# Patient Record
Sex: Female | Born: 1935
Health system: Southern US, Community
[De-identification: ages and names within clinical notes are randomized; demographics above are authoritative.]

## PROBLEM LIST (undated history)

## (undated) DIAGNOSIS — I2510 Atherosclerotic heart disease of native coronary artery without angina pectoris: Secondary | ICD-10-CM

## (undated) DIAGNOSIS — I219 Acute myocardial infarction, unspecified: Secondary | ICD-10-CM

## (undated) DIAGNOSIS — I10 Essential (primary) hypertension: Secondary | ICD-10-CM

## (undated) DIAGNOSIS — R112 Nausea with vomiting, unspecified: Secondary | ICD-10-CM

## (undated) DIAGNOSIS — E119 Type 2 diabetes mellitus without complications: Secondary | ICD-10-CM

## (undated) DIAGNOSIS — Z9889 Other specified postprocedural states: Secondary | ICD-10-CM

## (undated) HISTORY — DX: Nausea with vomiting, unspecified: R11.2

## (undated) HISTORY — DX: Type 2 diabetes mellitus without complications: E11.9

## (undated) HISTORY — PX: CARDIAC SURGERY: SHX584

## (undated) HISTORY — DX: Other specified postprocedural states: Z98.890

---

## 1997-09-10 DIAGNOSIS — I219 Acute myocardial infarction, unspecified: Secondary | ICD-10-CM

## 1997-09-10 HISTORY — DX: Acute myocardial infarction, unspecified: I21.9

## 2012-06-08 DIAGNOSIS — R079 Chest pain, unspecified: Secondary | ICD-10-CM

## 2013-01-19 ENCOUNTER — Encounter (INDEPENDENT_AMBULATORY_CARE_PROVIDER_SITE_OTHER): Payer: Self-pay

## 2013-07-07 ENCOUNTER — Ambulatory Visit (INDEPENDENT_AMBULATORY_CARE_PROVIDER_SITE_OTHER): Payer: Medicare Other

## 2013-07-07 ENCOUNTER — Encounter (INDEPENDENT_AMBULATORY_CARE_PROVIDER_SITE_OTHER): Payer: Self-pay

## 2013-07-07 DIAGNOSIS — Z23 Encounter for immunization: Secondary | ICD-10-CM

## 2013-09-08 ENCOUNTER — Telehealth: Payer: Self-pay | Admitting: *Deleted

## 2013-09-08 MED ORDER — AZITHROMYCIN 250 MG PO TABS: ORAL_TABLET | ORAL | Status: DC

## 2013-09-08 NOTE — Telephone Encounter (Signed)
Per DWm for sinus issues

## 2013-09-26 ENCOUNTER — Ambulatory Visit (INDEPENDENT_AMBULATORY_CARE_PROVIDER_SITE_OTHER): Payer: Medicare Other | Admitting: General Practice

## 2013-09-26 ENCOUNTER — Encounter: Payer: Self-pay | Admitting: General Practice

## 2013-09-26 VITALS — BP 139/73 | HR 64 | Temp 97.3°F | Ht 60.0 in | Wt 131.0 lb

## 2013-09-26 DIAGNOSIS — R42 Dizziness and giddiness: Secondary | ICD-10-CM

## 2013-09-26 DIAGNOSIS — H6120 Impacted cerumen, unspecified ear: Secondary | ICD-10-CM

## 2013-09-26 MED ORDER — MECLIZINE HCL 25 MG PO TABS: 25.0000 mg | ORAL_TABLET | Freq: Three times a day (TID) | ORAL | Status: DC | PRN

## 2013-09-26 NOTE — Progress Notes (Signed)
   Subjective:    Patient ID: Ariel Rodriguez, female    DOB: 1931/04/28, 80 y.o.   MRN: [id number]  Dizziness This is a new problem. The current episode started yesterday. The problem has been unchanged. Associated symptoms include vertigo. Pertinent negatives include no chest pain, chills, congestion, coughing, fatigue, fever, nausea, neck pain, numbness, sore throat, visual change, vomiting or weakness. Exacerbated by: turning of head. She has tried lying down and rest for the symptoms.      Review of Systems  Constitutional: Negative for fever, chills and fatigue.  HENT: Negative for congestion and sore throat.   Respiratory: Negative for cough.   Cardiovascular: Negative for chest pain.  Gastrointestinal: Negative for nausea and vomiting.  Musculoskeletal: Negative for neck pain.  Neurological: Positive for dizziness and vertigo. Negative for weakness and numbness.       Objective:   Physical Exam  Constitutional: She is oriented to person, place, and time. She appears well-developed and well-nourished.  HENT:  Head: Normocephalic and atraumatic.  Right Ear: External ear normal.  Left Ear: External ear normal.  Nose: Nose normal.  Mouth/Throat: Oropharynx is clear and moist.  Cerumen impaction to bilateral ears, unable to visualize TM -after cerumen removal, bilateral TM pearly gray  Cardiovascular: Normal rate, regular rhythm and normal heart sounds.   Pulmonary/Chest: Effort normal and breath sounds normal. No respiratory distress. She exhibits no tenderness.  Neurological: She is alert and oriented to person, place, and time.  Skin: Skin is warm and dry.  Psychiatric: She has a normal mood and affect.          Assessment & Plan:  1. Vertigo  - meclizine (ANTIVERT) 25 MG tablet; Take 1 tablet (25 mg total) by mouth 3 (three) times daily as needed.  Dispense: 30 tablet; Refill: 0 -sedation precaution discussed -patient education provided and discussed  2.  Cerumen impaction -attempted cerumen removal manually (partially removed) -ears lavaged  -cerumen removal successful -RTO if symptoms worsen or unresolved Patient verbalized understanding Erby Pian, FNP-C

## 2013-09-26 NOTE — Patient Instructions (Signed)
Vertigo Vertigo means you feel like you or your surroundings are moving when they are not. Vertigo can be dangerous if it occurs when you are at work, driving, or performing difficult activities.  CAUSES  Vertigo occurs when there is a conflict of signals sent to your brain from the visual and sensory systems in your body. There are many different causes of vertigo, including:  Infections, especially in the inner ear.  A bad reaction to a drug or misuse of alcohol and medicines.  Withdrawal from drugs or alcohol.  Rapidly changing positions, such as lying down or rolling over in bed.  A migraine headache.  Decreased blood flow to the brain.  Increased pressure in the brain from a head injury, infection, tumor, or bleeding. SYMPTOMS  You may feel as though the world is spinning around or you are falling to the ground. Because your balance is upset, vertigo can cause nausea and vomiting. You may have involuntary eye movements (nystagmus). DIAGNOSIS  Vertigo is usually diagnosed by physical exam. If the cause of your vertigo is unknown, your caregiver may perform imaging tests, such as an MRI scan (magnetic resonance imaging). TREATMENT  Most cases of vertigo resolve on their own, without treatment. Depending on the cause, your caregiver may prescribe certain medicines. If your vertigo is related to body position issues, your caregiver may recommend movements or procedures to correct the problem. In rare cases, if your vertigo is caused by certain inner ear problems, you may need surgery. HOME CARE INSTRUCTIONS   Follow your caregiver's instructions.  Avoid driving.  Avoid operating heavy machinery.  Avoid performing any tasks that would be dangerous to you or others during a vertigo episode.  Tell your caregiver if you notice that certain medicines seem to be causing your vertigo. Some of the medicines used to treat vertigo episodes can actually make them worse in some people. SEEK  IMMEDIATE MEDICAL CARE IF:   Your medicines do not relieve your vertigo or are making it worse.  You develop problems with talking, walking, weakness, or using your arms, hands, or legs.  You develop severe headaches.  Your nausea or vomiting continues or gets worse.  You develop visual changes.  A family member notices behavioral changes.  Your condition gets worse. MAKE SURE YOU:  Understand these instructions.  Will watch your condition.  Will get help right away if you are not doing well or get worse. Document Released: 06/06/2005 Document Revised: 11/19/2011 Document Reviewed: 03/15/2011 Exit[employer] Patient Information 2014 [employer].  

## 2014-02-08 ENCOUNTER — Emergency Department (HOSPITAL_COMMUNITY): Payer: Medicare Other

## 2014-02-08 ENCOUNTER — Inpatient Hospital Stay (HOSPITAL_COMMUNITY)
Admission: EM | Admit: 2014-02-08 | Discharge: 2014-02-10 | DRG: 287 | Disposition: A | Discharge status: Home / Self Care | Payer: Medicare Other | Attending: Cardiology | Admitting: Cardiology

## 2014-02-08 ENCOUNTER — Encounter (HOSPITAL_COMMUNITY): Payer: Self-pay | Admitting: Emergency Medicine

## 2014-02-08 DIAGNOSIS — I252 Old myocardial infarction: Secondary | ICD-10-CM

## 2014-02-08 DIAGNOSIS — Z7982 Long term (current) use of aspirin: Secondary | ICD-10-CM

## 2014-02-08 DIAGNOSIS — E785 Hyperlipidemia, unspecified: Secondary | ICD-10-CM | POA: Diagnosis present

## 2014-02-08 DIAGNOSIS — E119 Type 2 diabetes mellitus without complications: Secondary | ICD-10-CM | POA: Diagnosis present

## 2014-02-08 DIAGNOSIS — Z951 Presence of aortocoronary bypass graft: Secondary | ICD-10-CM

## 2014-02-08 DIAGNOSIS — I10 Essential (primary) hypertension: Secondary | ICD-10-CM | POA: Diagnosis present

## 2014-02-08 DIAGNOSIS — Z79899 Other long term (current) drug therapy: Secondary | ICD-10-CM

## 2014-02-08 DIAGNOSIS — I2510 Atherosclerotic heart disease of native coronary artery without angina pectoris: Secondary | ICD-10-CM | POA: Diagnosis present

## 2014-02-08 DIAGNOSIS — E876 Hypokalemia: Secondary | ICD-10-CM | POA: Diagnosis present

## 2014-02-08 DIAGNOSIS — I200 Unstable angina: Principal | ICD-10-CM | POA: Insufficient documentation

## 2014-02-08 DIAGNOSIS — E7800 Pure hypercholesterolemia, unspecified: Secondary | ICD-10-CM

## 2014-02-08 HISTORY — DX: Acute myocardial infarction, unspecified: I21.9

## 2014-02-08 HISTORY — DX: Essential (primary) hypertension: I10

## 2014-02-08 HISTORY — DX: Atherosclerotic heart disease of native coronary artery without angina pectoris: I25.10

## 2014-02-08 LAB — CBC
HEMATOCRIT: 36.5 % (ref 36.0–46.0)
HEMOGLOBIN: 12.8 g/dL (ref 12.0–15.0)
MCH: 31.8 pg (ref 26.0–34.0)
MCHC: 35.1 g/dL (ref 30.0–36.0)
MCV: 90.6 fL (ref 78.0–100.0)
Platelets: 209 10*3/uL (ref 150–400)
RBC: 4.03 MIL/uL (ref 3.87–5.11)
RDW: 12.9 % (ref 11.5–15.5)
WBC: 8.6 10*3/uL (ref 4.0–10.5)

## 2014-02-08 LAB — BASIC METABOLIC PANEL
BUN: 12 mg/dL (ref 6–23)
CHLORIDE: 94 meq/L — AB (ref 96–112)
CO2: 24 meq/L (ref 19–32)
CREATININE: 0.85 mg/dL (ref 0.50–1.10)
Calcium: 10 mg/dL (ref 8.4–10.5)
GFR calc non Af Amer: 63 mL/min — ABNORMAL LOW (ref >90)
GFR, EST AFRICAN AMERICAN: 72 mL/min — AB (ref >90)
GLUCOSE: 109 mg/dL — AB (ref 70–99)
POTASSIUM: 3.7 meq/L (ref 3.7–5.3)
Sodium: 137 mEq/L (ref 137–147)

## 2014-02-08 LAB — I-STAT TROPONIN, ED: Troponin i, poc: 0 ng/mL (ref 0.00–0.08)

## 2014-02-08 LAB — TSH: TSH: 3.86 u[IU]/mL (ref 0.350–4.500)

## 2014-02-08 LAB — GLUCOSE, CAPILLARY: Glucose-Capillary: 97 mg/dL (ref 70–99)

## 2014-02-08 LAB — PRO B NATRIURETIC PEPTIDE
PRO B NATRI PEPTIDE: 146.6 pg/mL (ref 0–450)
Pro B Natriuretic peptide (BNP): 110.5 pg/mL (ref 0–450)

## 2014-02-08 LAB — TROPONIN I: Troponin I: <0.3 ng/mL (ref <0.30)

## 2014-02-08 MED ORDER — NITROGLYCERIN 0.4 MG SL SUBL: 0.4000 mg | SUBLINGUAL_TABLET | SUBLINGUAL | Status: DC | PRN

## 2014-02-08 MED ORDER — HEPARIN BOLUS VIA INFUSION
3500.0000 [IU] | Freq: Once | INTRAVENOUS | Status: AC
  Administered 2014-02-08: 3500 [IU] via INTRAVENOUS
  Filled 2014-02-08: qty 3500

## 2014-02-08 MED ORDER — TICAGRELOR 90 MG PO TABS
180.0000 mg | ORAL_TABLET | Freq: Once | ORAL | Status: AC
  Administered 2014-02-09: 180 mg via ORAL
  Filled 2014-02-08: qty 2

## 2014-02-08 MED ORDER — LEVOTHYROXINE SODIUM 75 MCG PO TABS
75.0000 ug | ORAL_TABLET | Freq: Every day | ORAL | Status: DC
  Administered 2014-02-09 – 2014-02-10 (×2): 75 ug via ORAL
  Filled 2014-02-08 (×4): qty 1

## 2014-02-08 MED ORDER — HYDROCHLOROTHIAZIDE 12.5 MG PO CAPS
12.5000 mg | ORAL_CAPSULE | Freq: Every day | ORAL | Status: DC
  Filled 2014-02-08: qty 1

## 2014-02-08 MED ORDER — ALPRAZOLAM 0.25 MG PO TABS
0.2500 mg | ORAL_TABLET | Freq: Every evening | ORAL | Status: DC | PRN
Start: 2014-02-08 — End: 2014-02-10
  Administered 2014-02-08 – 2014-02-09 (×2): 0.25 mg via ORAL
  Filled 2014-02-08 (×2): qty 1

## 2014-02-08 MED ORDER — PANTOPRAZOLE SODIUM 40 MG PO TBEC
40.0000 mg | DELAYED_RELEASE_TABLET | Freq: Every day | ORAL | Status: DC
  Administered 2014-02-09 – 2014-02-10 (×2): 40 mg via ORAL
  Filled 2014-02-08 (×2): qty 1

## 2014-02-08 MED ORDER — ATENOLOL 25 MG PO TABS
25.0000 mg | ORAL_TABLET | Freq: Every day | ORAL | Status: DC
  Administered 2014-02-09 – 2014-02-10 (×2): 25 mg via ORAL
  Filled 2014-02-08 (×2): qty 1

## 2014-02-08 MED ORDER — LISINOPRIL 20 MG PO TABS
20.0000 mg | ORAL_TABLET | Freq: Every day | ORAL | Status: DC
  Administered 2014-02-09 – 2014-02-10 (×2): 20 mg via ORAL
  Filled 2014-02-08 (×2): qty 1

## 2014-02-08 MED ORDER — ASPIRIN 81 MG PO CHEW
243.0000 mg | CHEWABLE_TABLET | Freq: Once | ORAL | Status: AC
  Administered 2014-02-08: 243 mg via ORAL
  Filled 2014-02-08: qty 3

## 2014-02-08 MED ORDER — TICAGRELOR 90 MG PO TABS
90.0000 mg | ORAL_TABLET | Freq: Two times a day (BID) | ORAL | Status: DC
  Filled 2014-02-08: qty 1

## 2014-02-08 MED ORDER — SODIUM CHLORIDE 0.9 % IV SOLN
INTRAVENOUS | Status: DC
  Administered 2014-02-09: 07:00:00 via INTRAVENOUS

## 2014-02-08 MED ORDER — SODIUM CHLORIDE 0.9 % IV SOLN: 250.0000 mL | INTRAVENOUS | Status: DC | PRN

## 2014-02-08 MED ORDER — INSULIN ASPART 100 UNIT/ML ~~LOC~~ SOLN: 0.0000 [IU] | Freq: Three times a day (TID) | SUBCUTANEOUS | Status: DC

## 2014-02-08 MED ORDER — ATORVASTATIN CALCIUM 10 MG PO TABS
10.0000 mg | ORAL_TABLET | Freq: Every day | ORAL | Status: DC
  Administered 2014-02-09 – 2014-02-10 (×2): 10 mg via ORAL
  Filled 2014-02-08 (×2): qty 1

## 2014-02-08 MED ORDER — SODIUM CHLORIDE 0.9 % IJ SOLN
3.0000 mL | Freq: Two times a day (BID) | INTRAMUSCULAR | Status: DC
  Administered 2014-02-09: 3 mL via INTRAVENOUS

## 2014-02-08 MED ORDER — VITAMIN D3 25 MCG (1000 UNIT) PO TABS
1000.0000 [IU] | ORAL_TABLET | Freq: Every day | ORAL | Status: DC
  Administered 2014-02-09 – 2014-02-10 (×2): 1000 [IU] via ORAL
  Filled 2014-02-08 (×2): qty 1

## 2014-02-08 MED ORDER — ATENOLOL 50 MG PO TABS: 50.0000 mg | ORAL_TABLET | Freq: Every day | ORAL | Status: DC

## 2014-02-08 MED ORDER — SODIUM CHLORIDE 0.9 % IJ SOLN: 3.0000 mL | INTRAMUSCULAR | Status: DC | PRN

## 2014-02-08 MED ORDER — ASPIRIN EC 81 MG PO TBEC
81.0000 mg | DELAYED_RELEASE_TABLET | Freq: Every day | ORAL | Status: DC
  Administered 2014-02-09 – 2014-02-10 (×2): 81 mg via ORAL
  Filled 2014-02-08 (×2): qty 1

## 2014-02-08 MED ORDER — LISINOPRIL-HYDROCHLOROTHIAZIDE 20-12.5 MG PO TABS: 1.0000 | ORAL_TABLET | Freq: Every day | ORAL | Status: DC

## 2014-02-08 MED ORDER — HEPARIN (PORCINE) IN NACL 100-0.45 UNIT/ML-% IJ SOLN
900.0000 [IU]/h | INTRAMUSCULAR | Status: DC
  Administered 2014-02-08: 700 [IU]/h via INTRAVENOUS
  Filled 2014-02-08 (×2): qty 250

## 2014-02-08 NOTE — ED Notes (Signed)
Pt reports MI in 1999 that had chest pain, neck pain and headache similar to today but worse and wanted to be checked for same, MI and heart surgery then performed at [facility].

## 2014-02-08 NOTE — ED Provider Notes (Signed)
CSN: [id number]     Arrival date & time 02/08/14  1435 History   First MD Initiated Contact with Patient 02/08/14 1525     Chief Complaint  Patient presents with  . Chest Pain     (Consider location/radiation/quality/duration/timing/severity/associated sxs/prior Treatment) HPI Comments: Patient is an 80 year old female with past medical history of coronary artery disease, status post CABG in 1999. She presents today with complaints of tightness in her chest, right shoulder, and back that has been occurring intermittently for the past 2 weeks. This is related with exertion and is relieved with rest. She states she can not make up the steps without having to rest. She denies any fevers or chills. She does feel short of breath with these symptoms. These symptoms are not unlike what she experienced with her prior cardiac event in 1999.  Her cardiologist and heart surgeon are both at [facility].  Patient is a 80 y.o. female presenting with chest pain. The history is provided by the patient.  Chest Pain Pain location:  Substernal area and R chest Pain quality: tightness   Pain radiates to:  R shoulder Pain radiates to the back: yes   Pain severity:  Moderate Onset quality:  Gradual Duration:  2 weeks Timing:  Constant Progression:  Worsening Chronicity:  New Relieved by:  Rest Worsened by:  Exertion   Past Medical History  Diagnosis Date  . Diabetes mellitus without complication   . Hypertension   . Heart attack   . Coronary artery disease    Past Surgical History  Procedure Laterality Date  . Cardiac surgery     History reviewed. No pertinent family history. History  Substance Use Topics  . Smoking status: Never Smoker   . Smokeless tobacco: Not on file  . Alcohol Use: No   OB History   Grav Para Term Preterm Abortions TAB SAB Ect Mult Living                 Review of Systems  Cardiovascular: Positive for chest pain.  All other systems reviewed and are  negative.     Allergies  Ciprofloxacin and Erythromycin  Home Medications   Prior to Admission medications   Medication Sig Start Date End Date Taking? Authorizing Provider  ALPRAZolam Duanne Moron) 0.25 MG tablet Take 0.25 mg by mouth at bedtime as needed for anxiety.   Yes Historical Provider, MD  aspirin 325 MG tablet Take 325 mg by mouth daily.   Yes Historical Provider, MD  atenolol (TENORMIN) 50 MG tablet Take 50 mg by mouth daily. Takes one half daily   Yes Historical Provider, MD  atorvastatin (LIPITOR) 10 MG tablet Take 10 mg by mouth daily.   Yes Historical Provider, MD  Carboxymethylcellulose Sodium (REFRESH LIQUIGEL OP) Place 1 drop into both eyes at bedtime.   Yes Historical Provider, MD  cholecalciferol (VITAMIN D) 1000 UNITS tablet Take 1,000 Units by mouth daily.   Yes Historical Provider, MD  Cinnamon 500 MG capsule Take 500 mg by mouth every evening.   Yes Historical Provider, MD  DHA-EPA-Coenzyme Q10-Vitamin E 120-180-50-30 CAPS Take 1 capsule by mouth daily.   Yes Historical Provider, MD  levothyroxine (SYNTHROID, LEVOTHROID) 75 MCG tablet Take 75 mcg by mouth daily before breakfast.   Yes Historical Provider, MD  lisinopril-hydrochlorothiazide (PRINZIDE,ZESTORETIC) 20-12.5 MG per tablet Take 1 tablet by mouth daily.  01/27/14  Yes Historical Provider, MD  metFORMIN (GLUCOPHAGE) 500 MG tablet Take 500 mg by mouth 2 (two) times daily with a meal.  Yes Historical Provider, MD  Multiple Vitamin (MULTIVITAMIN) tablet Take 1 tablet by mouth every evening.    Yes Historical Provider, MD  NITROSTAT 0.4 MG SL tablet Place 0.4 mg under the tongue every 5 (five) minutes as needed.  01/06/14  Yes Historical Provider, MD  OVER THE COUNTER MEDICATION Apply 1 patch topically daily as needed (pain). Pain patch   Yes Historical Provider, MD  pantoprazole (PROTONIX) 40 MG tablet Take 40 mg by mouth daily.   Yes Historical Provider, MD  Polyvinyl Alcohol-Povidone (REFRESH OP) Place 1 drop into  both eyes 2 (two) times daily as needed (dry eyes).   Yes Historical Provider, MD   BP 165/72  Pulse 64  Temp(Src) 98 F (36.7 C) (Oral)  Resp 14  Ht 5' (1.524 m)  Wt 131 lb (59.421 kg)  BMI 25.58 kg/m2  SpO2 100% Physical Exam  Nursing note and vitals reviewed. Constitutional: She is oriented to person, place, and time. She appears well-developed and well-nourished. No distress.  HENT:  Head: Normocephalic and atraumatic.  Neck: Normal range of motion. Neck supple.  Cardiovascular: Normal rate and regular rhythm.  Exam reveals no gallop and no friction rub.   No murmur heard. Pulmonary/Chest: Effort normal and breath sounds normal. No respiratory distress. She has no wheezes.  Abdominal: Soft. Bowel sounds are normal. She exhibits no distension. There is no tenderness.  Musculoskeletal: Normal range of motion. She exhibits no edema.  Lymphadenopathy:    She has no cervical adenopathy.  Neurological: She is alert and oriented to person, place, and time.  Skin: Skin is warm and dry. She is not diaphoretic.    ED Course  Procedures (including critical care time) Labs Review Labs Reviewed  [facility], ED    Imaging Review No results found.   EKG Interpretation   Date/Time:  Monday February 08 2014 14:40:06 EDT Ventricular Rate:  71 PR Interval:  152 QRS Duration: 90 QT Interval:  378 QTC Calculation: 410 R Axis:   -45 Text Interpretation:  Normal sinus rhythm Left axis deviation Anteroseptal  infarct , age undetermined T wave abnormality, consider lateral ischemia  Abnormal ECG No priors for comparison Confirmed by DELOS  MD, Nathaneil Canary  [phone]) on 02/08/2014 3:35:09 PM      MDM   Final diagnoses:  None    Patient is an 80 year old female with history of coronary artery bypass graft 16 years ago. She presents today with complaints of discomfort in her chest and back that is not unlike the symptoms she  experienced with her prior MRI. Her cardiologist and heart surgeon are both at [facility], however she prefers to come here due to logistical reasons. Workup reveals no evidence for MI, however do to the nature of her symptoms and her extensive cardiac history, feel as though admission to the hospital is indicated. I've spoken with Dr. Burt Knack from cardiology who will evaluate patient.    Veryl Speak, MD 02/09/14 [phone]

## 2014-02-08 NOTE — Consult Note (Signed)
History and Physical  Patient ID: LARANDA BURKEMPER MRN: [id number], SOB: June 29, 1931 80 y.o. Date of Encounter: 02/08/2014, 6:01 PM  Primary Physician: Redge Gainer, MD Primary Cardiologist: Dr Duke Salvia [facility])  Chief Complaint: Chest pain  HPI: 80 y.o. female w/ PMHx significant for CAD s/p CABG who presented to [facility] on 02/08/2014 with complaints of chest and back pain.  The patient has a history of coronary artery disease. She underwent CABG in 1999 at [facility]. I believe she was grafted with the left radial and LIMA conduit. She has done well until the past 2 weeks when she has developed mid scapular upper back pain and chest pain. She describes this as a pressure and squeezing like sensation. It feels similar to the symptoms she had just before CABG, but less severe. She admits to worsening of her symptoms over the past few days with increased intensity and frequency of symptoms. Her chest discomfort radiates to the neck. There is associated shortness of breath. She denies diaphoresis, lightheadedness, nausea, vomiting, or syncope.  She otherwise has been in her usual state of health. She reports compliance with her medications. She denies any recent illness and specifically denies fevers, chills, respiratory symptoms, or GI symptoms. She is currently chest pain-free at rest.   Past Medical History  Diagnosis Date  . Diabetes mellitus without complication   . Hypertension   . Heart attack   . Coronary artery disease      Surgical History:  Past Surgical History  Procedure Laterality Date  . Cardiac surgery       Home Meds: Prior to Admission medications   Medication Sig Start Date End Date Taking? Authorizing Provider  ALPRAZolam Duanne Moron) 0.25 MG tablet Take 0.25 mg by mouth at bedtime as needed for anxiety.   Yes Historical Provider, MD  aspirin 325 MG tablet Take 325 mg by mouth daily.   Yes Historical Provider, MD    atenolol (TENORMIN) 50 MG tablet Take 50 mg by mouth daily. Takes one half daily   Yes Historical Provider, MD  atorvastatin (LIPITOR) 10 MG tablet Take 10 mg by mouth daily.   Yes Historical Provider, MD  Carboxymethylcellulose Sodium (REFRESH LIQUIGEL OP) Place 1 drop into both eyes at bedtime.   Yes Historical Provider, MD  cholecalciferol (VITAMIN D) 1000 UNITS tablet Take 1,000 Units by mouth daily.   Yes Historical Provider, MD  Cinnamon 500 MG capsule Take 500 mg by mouth every evening.   Yes Historical Provider, MD  DHA-EPA-Coenzyme Q10-Vitamin E 120-180-50-30 CAPS Take 1 capsule by mouth daily.   Yes Historical Provider, MD  levothyroxine (SYNTHROID, LEVOTHROID) 75 MCG tablet Take 75 mcg by mouth daily before breakfast.   Yes Historical Provider, MD  lisinopril-hydrochlorothiazide (PRINZIDE,ZESTORETIC) 20-12.5 MG per tablet Take 1 tablet by mouth daily.  01/27/14  Yes Historical Provider, MD  metFORMIN (GLUCOPHAGE) 500 MG tablet Take 500 mg by mouth 2 (two) times daily with a meal.    Yes Historical Provider, MD  Multiple Vitamin (MULTIVITAMIN) tablet Take 1 tablet by mouth every evening.    Yes Historical Provider, MD  NITROSTAT 0.4 MG SL tablet Place 0.4 mg under the tongue every 5 (five) minutes as needed for chest pain.  01/06/14  Yes Historical Provider, MD  OVER THE COUNTER MEDICATION Apply 1 patch topically daily as needed (pain). Pain patch   Yes Historical Provider, MD  pantoprazole (PROTONIX) 40 MG tablet Take 40 mg by mouth daily.  Yes Historical Provider, MD  Polyvinyl Alcohol-Povidone (REFRESH OP) Place 1 drop into both eyes 2 (two) times daily as needed (dry eyes).   Yes Historical Provider, MD    Allergies:  Allergies  Allergen Reactions  . Ciprofloxacin Diarrhea and Nausea And Vomiting  . Erythromycin Diarrhea and Nausea And Vomiting    History   Social History  . Marital Status: Married    Spouse Name: N/A    Number of Children: N/A  . Years of Education: N/A    Occupational History  . Not on file.   Social History Main Topics  . Smoking status: Never Smoker   . Smokeless tobacco: Not on file  . Alcohol Use: No  . Drug Use: No  . Sexual Activity: Not on file   Other Topics Concern  . Not on file   Social History Narrative  . No narrative on file     Family history: Her mother had congestive heart failure but lived into her 39s. There is no premature CAD in the family.  Review of Systems: General: negative for chills, fever, night sweats or weight changes.  ENT: negative for rhinorrhea or epistaxis Cardiovascular: See history of present illness  Dermatological: negative for rash Respiratory: negative for cough or wheezing GI: negative for nausea, vomiting, diarrhea, bright red blood per rectum, melena, or hematemesis GU: no hematuria, urgency, or frequency Neurologic: negative for visual changes, syncope, headache, or dizziness Heme: no easy bruising or bleeding Endo: negative for excessive thirst, thyroid disorder, or flushing Musculoskeletal: negative for joint pain or swelling, negative for myalgias All other systems reviewed and are otherwise negative except as noted above.  Physical Exam: Blood pressure 159/69, pulse 55, temperature 98.7 F (37.1 C), temperature source Oral, resp. rate 19, height 5' (1.524 m), weight 59.421 kg (131 lb), SpO2 97.00%. General: Well developed, well nourished, alert and oriented, very pleasant woman in no acute distress. HEENT: Normocephalic, atraumatic, sclera non-icteric, no xanthomas, nares are without discharge.  Neck: Supple. Carotids 2+ without bruits. JVP normal Lungs: Clear bilaterally to auscultation without wheezes, rales, or rhonchi. Breathing is unlabored. Heart: RRR with normal S1 and S2. No murmurs, rubs, or gallops appreciated. Abdomen: Soft, non-tender, non-distended with normoactive bowel sounds. No hepatomegaly. No rebound/guarding. No obvious abdominal masses. Back: No CVA  tenderness Msk:  Strength and tone appear normal for age. Extremities: No clubbing, cyanosis, or edema.  Distal pedal pulses are 2+ and equal bilaterally. The left radial pulses nonpalpable Neuro: CNII-XII intact, moves all extremities spontaneously. Psych:  Responds to questions appropriately with a normal affect.   Labs:   Lab Results  Component Value Date   WBC 8.6 02/08/2014   HGB 12.8 02/08/2014   HCT 36.5 02/08/2014   MCV 90.6 02/08/2014   PLT 209 02/08/2014    Recent Labs Lab 02/08/14 1518  NA 137  K 3.7  CL 94*  CO2 24  BUN 12  CREATININE 0.85  CALCIUM 10.0  GLUCOSE 109*   No results found for this basename: CKTOTAL, CKMB, TROPONINI,  in the last 72 hours No results found for this basename: CHOL, HDL, LDLCALC, TRIG   No results found for this basename: DDIMER    Radiology/Studies:  Dg Chest 2 View  02/08/2014   CLINICAL DATA:  Pain.  EXAM: CHEST  2 VIEW  COMPARISON:  None.  FINDINGS: Mediastinum and hilar structures normal. Cardiomegaly with normal pulmonary vascularity. Prior median sternotomy and CABG. No pleural effusion or pneumothorax.  IMPRESSION: 1. Cardiomegaly. Prior median  sternotomy. No overt congestive heart failure.  2.  No focal pulmonary infiltrate.   Electronically Signed   By: Marcello Moores  Register   On: 02/08/2014 17:43     EKG: Normal sinus rhythm with age-indeterminate anteroseptal infarct, possible age-indeterminate inferior infarct, and nonspecific ST abnormality  ASSESSMENT AND PLAN:  This is an 80 year old woman with known CAD status post remote CABG, underlying chronic medical problems including diabetes, hypercholesterolemia, and hypertension, presenting with classic symptoms of unstable angina pectoris. Her chest pain syndrome has an accelerating pattern. I have recommended cardiac catheterization and possible PCI. I have reviewed the risks, potential benefits, and alternatives to cardiac catheterization and possible PCI. Risks include but are not limited  to stroke, bleeding, myocardial infarction, emergency heart surgery, arrhythmia, and death. She understands these risks are rare and occur at a frequency less than 1%. She agrees to proceed and will be scheduled for her procedure tomorrow. In the meantime, will start IV heparin and load her with brilinta. Otherwise will place her on sliding scale insulin for treatment of her diabetes, and continue her beta blocker and statin drug. Metformin will be placed on hold in anticipation of radiocontrast administration.  SignedSherren Mocha, M.D., [facility] 02/08/2014, 6:01 PM

## 2014-02-08 NOTE — Consult Note (Signed)
ANTICOAGULATION CONSULT NOTE - Initial Consult  Pharmacy Consult for Heparin Indication: chest pain/ACS  Allergies  Allergen Reactions  . Ciprofloxacin Diarrhea and Nausea And Vomiting  . Erythromycin Diarrhea and Nausea And Vomiting    Patient Measurements: Height: 5' (152.4 cm) Weight: 131 lb (59.421 kg) IBW/kg (Calculated) : 45.5 Heparin Dosing Weight: 59kg  Vital Signs: Temp: 98.7 F (37.1 C) (06/01 1633) Temp src: Oral (06/01 1633) BP: 127/53 mmHg (06/01 1820) Pulse Rate: 58 (06/01 1820)  Labs:  Recent Labs  02/08/14 1518  HGB 12.8  HCT 36.5  PLT 209  CREATININE 0.85    Estimated Creatinine Clearance: 41.9 ml/min (by C-G formula based on Cr of 0.85).   Medical History: Past Medical History  Diagnosis Date  . Diabetes mellitus without complication   . Hypertension   . Heart attack   . Coronary artery disease     Medications:  No anticoagulants pta  Assessment: 83yof with history of CAD s/p CABG presents to the ED with chest pain. She will begin IV heparin with plans for cath tomorrow. Baseline renal function and CBC ok.  Goal of Therapy:  Heparin level 0.3-0.7 units/ml Monitor platelets by anticoagulation protocol: Yes   Plan:  1) Heparin bolus 3500 units x 1 2) Heparin drip at 700 units/hr 3) Check 8 hour heparin level 4) Daily heparin level and CBC  Benjamine Sprague Tanita Palinkas 02/08/2014,6:49 PM

## 2014-02-08 NOTE — ED Notes (Signed)
Pt states her pain is much better- she only has a slight pain in her back

## 2014-02-08 NOTE — ED Notes (Addendum)
Pt took 81mg  ASA at home and 1Nitro with some relief.

## 2014-02-08 NOTE — ED Notes (Signed)
Pt and family updated on plan. Pt is resting with son at bedside

## 2014-02-09 ENCOUNTER — Other Ambulatory Visit: Payer: Self-pay

## 2014-02-09 ENCOUNTER — Encounter (HOSPITAL_COMMUNITY)
Admission: EM | Disposition: A | Discharge status: Home / Self Care | Payer: Medicare Other | Source: Home / Self Care | Attending: Cardiology

## 2014-02-09 DIAGNOSIS — Z951 Presence of aortocoronary bypass graft: Secondary | ICD-10-CM

## 2014-02-09 DIAGNOSIS — E119 Type 2 diabetes mellitus without complications: Secondary | ICD-10-CM | POA: Diagnosis present

## 2014-02-09 DIAGNOSIS — I10 Essential (primary) hypertension: Secondary | ICD-10-CM | POA: Diagnosis present

## 2014-02-09 DIAGNOSIS — I252 Old myocardial infarction: Secondary | ICD-10-CM

## 2014-02-09 DIAGNOSIS — I2510 Atherosclerotic heart disease of native coronary artery without angina pectoris: Secondary | ICD-10-CM

## 2014-02-09 DIAGNOSIS — E7800 Pure hypercholesterolemia, unspecified: Secondary | ICD-10-CM

## 2014-02-09 HISTORY — PX: LEFT HEART CATHETERIZATION WITH CORONARY/GRAFT ANGIOGRAM: SHX5450

## 2014-02-09 LAB — GLUCOSE, CAPILLARY
GLUCOSE-CAPILLARY: 116 mg/dL — AB (ref 70–99)
GLUCOSE-CAPILLARY: 116 mg/dL — AB (ref 70–99)
Glucose-Capillary: 114 mg/dL — ABNORMAL HIGH (ref 70–99)
Glucose-Capillary: 97 mg/dL (ref 70–99)
Glucose-Capillary: 96 mg/dL (ref 70–99)

## 2014-02-09 LAB — CBC
HCT: 35.7 % — ABNORMAL LOW (ref 36.0–46.0)
HEMOGLOBIN: 12.4 g/dL (ref 12.0–15.0)
MCH: 31.2 pg (ref 26.0–34.0)
MCHC: 34.7 g/dL (ref 30.0–36.0)
MCV: 89.9 fL (ref 78.0–100.0)
Platelets: 198 10*3/uL (ref 150–400)
RBC: 3.97 MIL/uL (ref 3.87–5.11)
RDW: 12.8 % (ref 11.5–15.5)
WBC: 8.6 10*3/uL (ref 4.0–10.5)

## 2014-02-09 LAB — TROPONIN I: Troponin I: <0.3 ng/mL (ref <0.30)

## 2014-02-09 LAB — HEMOGLOBIN A1C
Hgb A1c MFr Bld: 6.4 % — ABNORMAL HIGH (ref <5.7)
Mean Plasma Glucose: 137 mg/dL — ABNORMAL HIGH (ref <117)

## 2014-02-09 LAB — LIPID PANEL
Cholesterol: 149 mg/dL (ref 0–200)
HDL: 44 mg/dL (ref >39)
LDL CALC: 63 mg/dL (ref 0–99)
TRIGLYCERIDES: 208 mg/dL — AB (ref <150)
Total CHOL/HDL Ratio: 3.4 RATIO
VLDL: 42 mg/dL — AB (ref 0–40)

## 2014-02-09 LAB — PROTIME-INR
INR: 1.1 (ref 0.00–1.49)
Prothrombin Time: 14 seconds (ref 11.6–15.2)

## 2014-02-09 LAB — BASIC METABOLIC PANEL
BUN: 15 mg/dL (ref 6–23)
CO2: 28 meq/L (ref 19–32)
Calcium: 9.5 mg/dL (ref 8.4–10.5)
Chloride: 98 mEq/L (ref 96–112)
Creatinine, Ser: 0.85 mg/dL (ref 0.50–1.10)
GFR calc Af Amer: 72 mL/min — ABNORMAL LOW (ref >90)
GFR, EST NON AFRICAN AMERICAN: 63 mL/min — AB (ref >90)
GLUCOSE: 117 mg/dL — AB (ref 70–99)
Potassium: 3.5 mEq/L — ABNORMAL LOW (ref 3.7–5.3)
SODIUM: 139 meq/L (ref 137–147)

## 2014-02-09 LAB — POCT ACTIVATED CLOTTING TIME: Activated Clotting Time: 99 seconds

## 2014-02-09 LAB — HEPARIN LEVEL (UNFRACTIONATED)
Heparin Unfractionated: 0.33 IU/mL (ref 0.30–0.70)
Heparin Unfractionated: 0.18 IU/mL — ABNORMAL LOW (ref 0.30–0.70)

## 2014-02-09 SURGERY — LEFT HEART CATHETERIZATION WITH CORONARY/GRAFT ANGIOGRAM
Anesthesia: LOCAL

## 2014-02-09 MED ORDER — SODIUM CHLORIDE 0.9 % IV SOLN
1.0000 mL/kg/h | INTRAVENOUS | Status: AC
  Administered 2014-02-09: 1 mL/kg/h via INTRAVENOUS

## 2014-02-09 MED ORDER — HEPARIN (PORCINE) IN NACL 2-0.9 UNIT/ML-% IJ SOLN
INTRAMUSCULAR | Status: AC
  Filled 2014-02-09: qty 1000

## 2014-02-09 MED ORDER — NITROGLYCERIN 0.2 MG/ML ON CALL CATH LAB
INTRAVENOUS | Status: AC
  Filled 2014-02-09: qty 1

## 2014-02-09 MED ORDER — MIDAZOLAM HCL 2 MG/2ML IJ SOLN
INTRAMUSCULAR | Status: AC
  Filled 2014-02-09: qty 2

## 2014-02-09 MED ORDER — AMLODIPINE BESYLATE 5 MG PO TABS
5.0000 mg | ORAL_TABLET | Freq: Every day | ORAL | Status: DC
  Administered 2014-02-09 – 2014-02-10 (×2): 5 mg via ORAL
  Filled 2014-02-09 (×2): qty 1

## 2014-02-09 MED ORDER — FENTANYL CITRATE 0.05 MG/ML IJ SOLN
INTRAMUSCULAR | Status: AC
  Filled 2014-02-09: qty 2

## 2014-02-09 MED ORDER — POTASSIUM CHLORIDE CRYS ER 20 MEQ PO TBCR
40.0000 meq | EXTENDED_RELEASE_TABLET | Freq: Once | ORAL | Status: AC
  Administered 2014-02-09: 40 meq via ORAL
  Filled 2014-02-09: qty 2

## 2014-02-09 MED ORDER — ASPIRIN 81 MG PO CHEW: 81.0000 mg | CHEWABLE_TABLET | Freq: Every day | ORAL | Status: DC

## 2014-02-09 MED ORDER — LIDOCAINE HCL (PF) 1 % IJ SOLN
INTRAMUSCULAR | Status: AC
  Filled 2014-02-09: qty 30

## 2014-02-09 NOTE — H&P (View-Only) (Signed)
  Cards: [facility] (Dr. Ghandi)  Subjective:  No further CP. Yesterday CP, throat discomfort, Back pain.   Objective:  Vital Signs in the last 24 hours: Temp:  [98 F (36.7 C)-98.7 F (37.1 C)] 98.2 F (36.8 C) (06/02 0403) Pulse Rate:  [55-69] 62 (06/02 0403) Resp:  [0-22] 18 (06/02 0403) BP: (127-191)/(53-98) 154/60 mmHg (06/02 0403) SpO2:  [96 %-100 %] 96 % (06/02 0403) Weight:  [131 lb (59.421 kg)-133 lb 3.2 oz (60.419 kg)] 133 lb 2.5 oz (60.399 kg) (06/02 0403)  Intake/Output from previous day: 06/01 0701 - 06/02 0700 In: -  Out: 750 [Urine:750]   Physical Exam: General: Well developed, well nourished, in no acute distress. Head:  Normocephalic and atraumatic. Lungs: Clear to auscultation and percussion. Heart: Normal S1 and S2.  No murmur, rubs or gallops.  Abdomen: soft, non-tender, positive bowel sounds. Extremities: No clubbing or cyanosis. No edema. Neurologic: Alert and oriented x 3.    Lab Results:  Recent Labs  02/08/14 1518 02/09/14 0315  WBC 8.6 8.6  HGB 12.8 12.4  PLT 209 198    Recent Labs  02/08/14 1518 02/09/14 0315  NA 137 139  K 3.7 3.5*  CL 94* 98  CO2 24 28  GLUCOSE 109* 117*  BUN 12 15  CREATININE 0.85 0.85    Recent Labs  02/08/14 2026 02/09/14 0315  TROPONINI <0.30 <0.30    Recent Labs  02/09/14 0315  CHOL 149    Imaging: Dg Chest 2 View  02/08/2014   CLINICAL DATA:  Pain.  EXAM: CHEST  2 VIEW  COMPARISON:  None.  FINDINGS: Mediastinum and hilar structures normal. Cardiomegaly with normal pulmonary vascularity. Prior median sternotomy and CABG. No pleural effusion or pneumothorax.  IMPRESSION: 1. Cardiomegaly. Prior median sternotomy. No overt congestive heart failure.  2.  No focal pulmonary infiltrate.   Electronically Signed   By: Thomas  Register   On: 02/08/2014 17:43   Personally viewed.   Telemetry: No adverse rhythms.  Personally viewed.   EKG:  6/2 - NSR, inf/ant infarct pattern, NSSTW changes. TWI less  pronounced in aVL.   Cardiac Studies:  Cath today  . aspirin EC  81 mg Oral Daily  . atenolol  25 mg Oral Daily  . atorvastatin  10 mg Oral Daily  . cholecalciferol  1,000 Units Oral Daily  . hydrochlorothiazide  12.5 mg Oral Daily  . insulin aspart  0-15 Units Subcutaneous TID WC  . levothyroxine  75 mcg Oral QAC breakfast  . lisinopril  20 mg Oral Daily  . pantoprazole  40 mg Oral Daily  . sodium chloride  3 mL Intravenous Q12H  . ticagrelor  90 mg Oral BID   Assessment/Plan:   1) Unstable angina  - cath today (3pm), OK for breakfast  - Heparin IV, Brilinta loaded, ASA, Bb, ACE-I  - Trop neg  - ECG with less pronounced TWI aVL  2) CAD  - s/p CABG 1999 (free radial and LIMA conduit according to HPI).  3) HTN  - elevated  - will add amlodipine 5. Pulse upper 50's no room for increase in Bb. ACE on board with HCT.    4) Hyperlipidemia  - continue with statin  - LDL 63 at goal.   5) Hypokalemia   - 3.5, replete. K-dur 40meq x 1   6) DM  - controlled. A1c 6.4   Charnelle Bergeman 02/09/2014, 8:09 AM     

## 2014-02-09 NOTE — Consult Note (Signed)
[facility] for Heparin Indication: chest pain/ACS  Allergies  Allergen Reactions  . Ciprofloxacin Diarrhea and Nausea And Vomiting  . Erythromycin Diarrhea and Nausea And Vomiting    Patient Measurements: Height: 5' (152.4 cm) Weight: 133 lb 2.5 oz (60.399 kg) IBW/kg (Calculated) : 45.5 Heparin Dosing Weight: 59kg  Vital Signs: Temp: 98.2 F (36.8 C) (06/02 0403) Temp src: Oral (06/02 0403) BP: 154/60 mmHg (06/02 0403) Pulse Rate: 62 (06/02 0403)  Labs:  Recent Labs  02/08/14 1518 02/08/14 2026 02/09/14 0315  HGB 12.8  --  12.4  HCT 36.5  --  35.7*  PLT 209  --  198  LABPROT  --   --  14.0  INR  --   --  1.10  HEPARINUNFRC  --   --  0.33  CREATININE 0.85  --  0.85  TROPONINI  --  <0.30 <0.30    Estimated Creatinine Clearance: 42.2 ml/min (by C-G formula based on Cr of 0.85).  Assessment: 80 yo female with chest pain for heparin  Goal of Therapy:  Heparin level 0.3-0.7 units/ml Monitor platelets by anticoagulation protocol: Yes   Plan:  Continue Heparin at current rate F/U after cath today   [facility] [place] 02/09/2014,4:29 AM

## 2014-02-09 NOTE — Care Management Note (Unsigned)
    Page 1 of 1   02/09/2014     10:17:45 AM CARE MANAGEMENT NOTE 02/09/2014  Patient:  Ariel Rodriguez, Ariel Rodriguez   Account Number:  [id number]  Date Initiated:  02/09/2014  Documentation initiated by:  GRAVES-BIGELOW,Keidy Thurgood  Subjective/Objective Assessment:   Pt admitted for unstable angina. Initiated on IV heparin gtt. Plan for cath 02-09-14.     Action/Plan:   CM will continue to monitor for disposition needs.   Anticipated DC Date:  02/11/2014   Anticipated DC Plan:  [place]  CM consult      Choice offered to / List presented to:             Status of service:  In process, will continue to follow Medicare Important Message given?  YES (If response is "NO", the following Medicare IM given date fields will be blank) Date Medicare IM given:  02/08/2014 Date Additional Medicare IM given:    Discharge Disposition:    Per UR Regulation:  Reviewed for med. necessity/level of care/duration of stay  If discussed at [place] of Stay Meetings, dates discussed:    Comments:

## 2014-02-09 NOTE — CV Procedure (Signed)
    PROCEDURE:  Left heart catheterization with selective coronary angiography, left ventriculogram.  Bypass graft angiogram.  INDICATIONS:  Unstable angina  The risks, benefits, and details of the procedure were explained to the patient.  The patient verbalized understanding and wanted to proceed.  Informed written consent was obtained.  PROCEDURE TECHNIQUE:  After Xylocaine anesthesia a 10F sheath was placed in the right femoral artery with a single anterior needle wall stick.   Left coronary angiography was done using a Judkins L4 guide catheter.  Right coronary angiography was done using a Judkins R4 guide catheter.  An IMA catheter was used to image the LIMA. It was difficult to get selective engagement.  Left ventriculography was done using a pigtail catheter. Manual compression was used for hemostasis.   CONTRAST:  Total of 125 cc.  COMPLICATIONS:  None.    HEMODYNAMICS:  Aortic pressure was 180/79; LV pressure was 178/12; LVEDP 16.  There was no gradient between the left ventricle and aorta.    ANGIOGRAPHIC DATA:   The left main coronary artery is widely patent.  The left anterior descending artery is a large vessel proximally. It is heavily calcified and tapers down in the mid segment. It is occluded at a septal perforator the mid LAD.  The LIMA to LAD is widely patent.  The left circumflex artery is a medium size vessel. There is a small ramus which is patent. The OM1 is patent. The remainder of the circumflex is small but patent.  The right coronary artery is a large dominant vessel. In the mid RCA, there is sequential 90% stenoses with diffuse disease. The posterior lateral artery is large and has competitive flow.  There is a radial graft which is anastomosed in an end-to-side fashion with the LIMA. It anastomoses to a branch of the distal right circulation. It loops around the apex. This graft appears widely patent.  LEFT VENTRICULOGRAM:  Left ventricular angiogram was done in  the 30 LAO projection and revealed normal left ventricular wall motion and systolic function with an estimated ejection fraction of 60 %.  LVEDP was 16 mmHg.  We did the ventriculogram in LAO since we did not know where the grafts were and were trying to visualize the origin. No grafts were noted and our attention was turned to the LIMA.  IMPRESSIONS:  1. Patent left main coronary artery. 2. Occluded mid left anterior descending artery.  Patent LIMA to LAD. 3. Patent left circumflex artery and its branches. 4. Severely diseased mid right coronary artery.  Radial artery to the distal RCA circulation is patent. It originates from an anastomosis to the LIMA, and a Y graft pattern. 5. Normal left ventricular systolic function.  LVEDP 16 mmHg.  Ejection fraction 60 %.  RECOMMENDATION:  Continue aggressive medical therapy. I stopped the Brilinta since she did not receive a stent.  Further medical therapy per Dr. Marlou Porch.  Cc: Dr. Elby Beck

## 2014-02-09 NOTE — Progress Notes (Signed)
ANTICOAGULATION CONSULT NOTE - Follow Up Consult  Pharmacy Consult for Heparin Indication: chest pain/ACS  Allergies  Allergen Reactions  . Ciprofloxacin Diarrhea and Nausea And Vomiting  . Erythromycin Diarrhea and Nausea And Vomiting    Patient Measurements: Height: 5' (152.4 cm) Weight: 133 lb 2.5 oz (60.399 kg) IBW/kg (Calculated) : 45.5 Heparin Dosing Weight:   Vital Signs: Temp: 98.2 F (36.8 C) (06/02 0403) Temp src: Oral (06/02 0403) BP: 154/60 mmHg (06/02 0403) Pulse Rate: 62 (06/02 0403)  Labs:  Recent Labs  02/08/14 1518 02/08/14 2026 02/09/14 0315 02/09/14 0745 02/09/14 1030  HGB 12.8  --  12.4  --   --   HCT 36.5  --  35.7*  --   --   PLT 209  --  198  --   --   LABPROT  --   --  14.0  --   --   INR  --   --  1.10  --   --   HEPARINUNFRC  --   --  0.33  --  0.18*  CREATININE 0.85  --  0.85  --   --   TROPONINI  --  <0.30 <0.30 <0.30  --     Estimated Creatinine Clearance: 42.2 ml/min (by C-G formula based on Cr of 0.85).   Medications:  Scheduled:  . amLODipine  5 mg Oral Daily  . aspirin EC  81 mg Oral Daily  . atenolol  25 mg Oral Daily  . atorvastatin  10 mg Oral Daily  . cholecalciferol  1,000 Units Oral Daily  . hydrochlorothiazide  12.5 mg Oral Daily  . insulin aspart  0-15 Units Subcutaneous TID WC  . levothyroxine  75 mcg Oral QAC breakfast  . lisinopril  20 mg Oral Daily  . pantoprazole  40 mg Oral Daily  . sodium chloride  3 mL Intravenous Q12H  . ticagrelor  90 mg Oral BID    Assessment: 80yo female with ACS for cath this afternoon.  Initial heparin level therapeutic on 700 units/hr, but repeat to verify was subtherapeutic at 0.18.  No bleeding noted.  Goal of Therapy:  Heparin level 0.3-0.7 units/ml Monitor platelets by anticoagulation protocol: Yes   Plan:  1-  Increase heparin to 900 units/hr 2-  F/U after cath  Gracy Bruins, PharmD Clinical Pharmacist [facility]

## 2014-02-09 NOTE — Interval H&P Note (Signed)
Cath Lab Visit (complete for each Cath Lab visit)  Clinical Evaluation Leading to the Procedure:   ACS: yes  Non-ACS:    Anginal Classification: CCS IV  Anti-ischemic medical therapy: Maximal Therapy (2 or more classes of medications)  Non-Invasive Test Results: No non-invasive testing performed  Prior CABG: Previous CABG      History and Physical Interval Note:  02/09/2014 2:48 PM  Ariel Rodriguez  has presented today for surgery, with the diagnosis of co  The various methods of treatment have been discussed with the patient and family. After consideration of risks, benefits and other options for treatment, the patient has consented to  Procedure(s): LEFT HEART CATHETERIZATION WITH CORONARY/GRAFT ANGIOGRAM (N/A) as a surgical intervention .  The patient's history has been reviewed, patient examined, no change in status, stable for surgery.  I have reviewed the patient's chart and labs.  Questions were answered to the patient's satisfaction.     Jettie Booze

## 2014-02-09 NOTE — Progress Notes (Signed)
UR Completed Jeferson Boozer Graves-Bigelow, RN,BSN [phone]  

## 2014-02-09 NOTE — Progress Notes (Signed)
  Cards: [facility] (Dr. Daiva Huge)  Subjective:  No further CP. Yesterday CP, throat discomfort, Back pain.   Objective:  Vital Signs in the last 24 hours: Temp:  [98 F (36.7 C)-98.7 F (37.1 C)] 98.2 F (36.8 C) (06/02 0403) Pulse Rate:  [55-69] 62 (06/02 0403) Resp:  [0-22] 18 (06/02 0403) BP: (127-191)/(53-98) 154/60 mmHg (06/02 0403) SpO2:  [96 %-100 %] 96 % (06/02 0403) Weight:  [131 lb (59.421 kg)-133 lb 3.2 oz (60.419 kg)] 133 lb 2.5 oz (60.399 kg) (06/02 0403)  Intake/Output from previous day: 06/01 0701 - 06/02 0700 In: -  Out: 750 [Urine:750]   Physical Exam: General: Well developed, well nourished, in no acute distress. Head:  Normocephalic and atraumatic. Lungs: Clear to auscultation and percussion. Heart: Normal S1 and S2.  No murmur, rubs or gallops.  Abdomen: soft, non-tender, positive bowel sounds. Extremities: No clubbing or cyanosis. No edema. Neurologic: Alert and oriented x 3.    Lab Results:  Recent Labs  02/08/14 1518 02/09/14 0315  WBC 8.6 8.6  HGB 12.8 12.4  PLT 209 198    Recent Labs  02/08/14 1518 02/09/14 0315  NA 137 139  K 3.7 3.5*  CL 94* 98  CO2 24 28  GLUCOSE 109* 117*  BUN 12 15  CREATININE 0.85 0.85    Recent Labs  02/08/14 2026 02/09/14 0315  TROPONINI <0.30 <0.30    Recent Labs  02/09/14 0315  CHOL 149    Imaging: Dg Chest 2 View  02/08/2014   CLINICAL DATA:  Pain.  EXAM: CHEST  2 VIEW  COMPARISON:  None.  FINDINGS: Mediastinum and hilar structures normal. Cardiomegaly with normal pulmonary vascularity. Prior median sternotomy and CABG. No pleural effusion or pneumothorax.  IMPRESSION: 1. Cardiomegaly. Prior median sternotomy. No overt congestive heart failure.  2.  No focal pulmonary infiltrate.   Electronically Signed   By: Marcello Moores  Register   On: 02/08/2014 17:43   Personally viewed.   Telemetry: No adverse rhythms.  Personally viewed.   EKG:  6/2 - NSR, inf/ant infarct pattern, NSSTW changes. TWI less  pronounced in aVL.   Cardiac Studies:  Cath today  . aspirin EC  81 mg Oral Daily  . atenolol  25 mg Oral Daily  . atorvastatin  10 mg Oral Daily  . cholecalciferol  1,000 Units Oral Daily  . hydrochlorothiazide  12.5 mg Oral Daily  . insulin aspart  0-15 Units Subcutaneous TID WC  . levothyroxine  75 mcg Oral QAC breakfast  . lisinopril  20 mg Oral Daily  . pantoprazole  40 mg Oral Daily  . sodium chloride  3 mL Intravenous Q12H  . ticagrelor  90 mg Oral BID   Assessment/Plan:   1) Unstable angina  - cath today (3pm), OK for breakfast  - Heparin IV, Brilinta loaded, ASA, Bb, ACE-I  - Trop neg  - ECG with less pronounced TWI aVL  2) CAD  - s/p CABG 1999 (free radial and LIMA conduit according to HPI).  3) HTN  - elevated  - will add amlodipine 5. Pulse upper 50's no room for increase in Bb. ACE on board with HCT.    4) Hyperlipidemia  - continue with statin  - LDL 63 at goal.   5) Hypokalemia   - 3.5, replete. K-dur 19meq x 1   6) DM  - controlled. A1c 6.4   Candee Furbish 02/09/2014, 8:09 AM

## 2014-02-10 DIAGNOSIS — I252 Old myocardial infarction: Secondary | ICD-10-CM

## 2014-02-10 DIAGNOSIS — I10 Essential (primary) hypertension: Secondary | ICD-10-CM

## 2014-02-10 DIAGNOSIS — Z951 Presence of aortocoronary bypass graft: Secondary | ICD-10-CM

## 2014-02-10 LAB — GLUCOSE, CAPILLARY: GLUCOSE-CAPILLARY: 121 mg/dL — AB (ref 70–99)

## 2014-02-10 MED ORDER — ATENOLOL 25 MG PO TABS: 25.0000 mg | ORAL_TABLET | Freq: Every day | ORAL | Status: DC

## 2014-02-10 MED ORDER — ISOSORBIDE MONONITRATE ER 30 MG PO TB24: 30.0000 mg | ORAL_TABLET | Freq: Every day | ORAL | Status: DC

## 2014-02-10 MED ORDER — ASPIRIN 81 MG PO TABS: 81.0000 mg | ORAL_TABLET | Freq: Every day | ORAL | Status: DC

## 2014-02-10 MED ORDER — AMLODIPINE BESYLATE 5 MG PO TABS: 5.0000 mg | ORAL_TABLET | Freq: Every day | ORAL | Status: DC

## 2014-02-10 NOTE — Discharge Summary (Signed)
Physician Discharge Summary  Patient ID: Ariel Rodriguez MRN: [id number] DOB/AGE: 11-17-1930 80 y.o.  Admit date: 02/08/2014 Discharge date: 02/10/2014  Primary Cardiologist: Dr. Burt Knack  Admission Diagnoses: Unstable Angina  Discharge Diagnoses:  Active Problems:   Old MI (myocardial infarction)   Coronary atherosclerosis of native coronary artery   Hx of CABG   Type II or unspecified type diabetes mellitus without mention of complication, not stated as uncontrolled   Pure hypercholesterolemia   Essential hypertension, benign   Discharged Condition: stable  Hospital Course: The patient is a 80 y.o. female w/ PMHx significant for CAD, s/p CABG, HTN, HLD and DM, who presented [facility]08[facility]2014 with complaints of chest and back pain.  She underwent CABG in 1999 at [facility] and she has been followed there by Dr. Dagoberto Ligas.  On arrival, she had endorsed a 2 week history of intermittent mid scapular/ upper back pain and chest pain, described as a pressure and squeezing like sensation that felt similar to the symptoms she had just before CABG, but less severe. She admitted to worsening of her symptoms over the past few days with increased intensity and frequency of symptoms. She noted associated SOB, but denied diaphoresis, lightheadedness, nausea, vomiting or syncope. Her EKG was w/o any acute ST changes. She was admitted for further evaluation. Cardiac enzymes were negative x 3. Lipid panel demonstrated her LDL to be at goal of < 70 at 63. However, triglycerides were elevated at 208.  Her Hgb A1c was also at goal of < 7.0, at 6.4. Given her history, she was referred for a LHC. The procedure was performed by Dr. Irish Lack via the right femoral artery. She was found to have a patent left main coronary artery and patent left circumflex. She had a mid LAD occulusion, however her LIMA to LAD was widely patent. There was severely diseased mid RCA but  the radial artery to the distal RCA was patent. She had normal left ventricular systolic function with an estimated EF of 60%. Continued aggressive medical therapy was recommended. She left the cath lab in stable condition. She was continued on ASA, a BB, ACE and statin. It should be noted that she had moderate hypertension during this admission. She required initiation of amlodipine as well as Imdur. Further titration of her BB was limited due to borderline bradycardia. She had improvement in her BP with the above medication adjustments. She had no post cath complications. The right femoral access site remained stable, free from hematoma and bruit. She had no difficulties ambulating and on recurrent CP. She was last seen and examined by Dr. Marlou Porch who determined she was stable for discharge home. She was instructed to continue to hold her Metformin for 48 hrs post cath. As requested by the patient, she will continue care with [facility]. She is scheduled for post hospital f/u with Richardson Dopp, PA-C, but will need to be followed by Dr. Burt Knack long term.    Consults: None  Significant Diagnostic Studies:   Diagnostic LHC 02/09/14 IMPRESSIONS:  1. Patent left main coronary artery. 2. Occluded mid left anterior descending artery. Patent LIMA to LAD. 3. Patent left circumflex artery and its branches. 4. Severely diseased mid right coronary artery. Radial artery to the distal RCA circulation is patent. It originates from an anastomosis to the LIMA, and a Y graft pattern. 5. Normal left ventricular systolic function. LVEDP 16 mmHg. Ejection fraction 60 %.  Treatments: See Hospital Course  Discharge Exam: Blood pressure 124/60, pulse 67, temperature 97.9 F (36.6 C), temperature source Oral, resp. rate 16, height 5' (1.524 m), weight 133 lb 2.5 oz (60.399 kg), SpO2 97.00%.   Disposition: 01-Home or Self Care      Discharge Instructions   Diet - low sodium heart healthy    Complete by:  As  directed      Discharge instructions    Complete by:  As directed   Wait until Friday 02/12/14 to restart Metformin     Driving Restrictions    Complete by:  As directed   No driving for 3 days     Increase activity slowly    Complete by:  As directed      Lifting restrictions    Complete by:  As directed   Do not lift more than 1/2 gallon of milk for 3 days            Medication List         ALPRAZolam 0.25 MG tablet  Commonly known as:  XANAX  Take 0.25 mg by mouth at bedtime as needed for anxiety.     amLODipine 5 MG tablet  Commonly known as:  NORVASC  Take 1 tablet (5 mg total) by mouth daily.     aspirin 81 MG tablet  Take 1 tablet (81 mg total) by mouth daily.     atenolol 25 MG tablet  Commonly known as:  TENORMIN  Take 1 tablet (25 mg total) by mouth daily. Takes one half daily     atorvastatin 10 MG tablet  Commonly known as:  LIPITOR  Take 10 mg by mouth daily.     cholecalciferol 1000 UNITS tablet  Commonly known as:  VITAMIN D  Take 1,000 Units by mouth daily.     Cinnamon 500 MG capsule  Take 500 mg by mouth every evening.     DHA-EPA-Coenzyme Q10-Vitamin E 120-180-50-30 Caps  Take 1 capsule by mouth daily.     isosorbide mononitrate 30 MG 24 hr tablet  Commonly known as:  IMDUR  Take 1 tablet (30 mg total) by mouth daily.     levothyroxine 75 MCG tablet  Commonly known as:  SYNTHROID, LEVOTHROID  Take 75 mcg by mouth daily before breakfast.     lisinopril-hydrochlorothiazide 20-12.5 MG per tablet  Commonly known as:  PRINZIDE,ZESTORETIC  Take 1 tablet by mouth daily.     metFORMIN 500 MG tablet  Commonly known as:  GLUCOPHAGE  Take 500 mg by mouth 2 (two) times daily with a meal.     multivitamin tablet  Take 1 tablet by mouth every evening.     NITROSTAT 0.4 MG SL tablet  Generic drug:  nitroGLYCERIN  Place 0.4 mg under the tongue every 5 (five) minutes as needed for chest pain.     OVER THE COUNTER MEDICATION  Apply 1 patch  topically daily as needed (pain). Pain patch     pantoprazole 40 MG tablet  Commonly known as:  PROTONIX  Take 40 mg by mouth daily.     REFRESH LIQUIGEL OP  Place 1 drop into both eyes at bedtime.     REFRESH OP  Place 1 drop into both eyes 2 (two) times daily as needed (dry eyes).       Follow-up Information   Follow up with Richardson Dopp, PA-C On 03/09/2014. (10:10 am )    Specialty:  Physician Assistant   Contact information:   [address]  [address] [place] [address] [phone]      TIME SPENT ON DISCHARGE, INCLUDING PHYSICIAN TIME: >30 MIN  Signed: Lyda Jester 02/10/2014, 1:53 PM

## 2014-02-10 NOTE — Discharge Summary (Signed)
Personally seen and examined. Agree with above. Candee Furbish, MD

## 2014-02-10 NOTE — Progress Notes (Signed)
CARDIAC REHAB PHASE I    MODE:  Ambulation: 350 ft   POST:  Rate/Rhythm: 60 pulse    BP: sitting 120/60     SaO2:   Pt slightly unsteady, hadn't been up much. Able to walk without any sx, denied chest tightness. Gave diet sheets and ex gl, discussed increasing ex on her treadmill. Review NTG. [id number]   [place], ACSM 02/10/2014 10:44 AM

## 2014-02-10 NOTE — Progress Notes (Signed)
Personally seen and examined. Agree with above. Candee Furbish, MD

## 2014-02-10 NOTE — Progress Notes (Signed)
Patient Profile: 80 y.o. female w/ PMHx significant for CAD s/p CABG who presented to [facility] on 02/08/2014 with complaints of chest and back pain.   Subjective: No further CP.   Objective: Vital signs in last 24 hours: Temp:  [97.9 F (36.6 C)-98.4 F (36.9 C)] 97.9 F (36.6 C) (06/03 0509) Pulse Rate:  [55-63] 61 (06/03 0509) Resp:  [15-17] 16 (06/03 0509) BP: (114-180)/(46-88) 165/88 mmHg (06/03 0512) SpO2:  [95 %-100 %] 97 % (06/03 0509) Last BM Date: 02/08/14  Intake/Output from previous day: 02-20-23 0701 - 06/03 0700 In: 3 [I.V.:3] Out: 1350 [Urine:1350] Intake/Output this shift: Total I/O In: 240 [P.O.:240] Out: 450 [Urine:450]  Medications Current Facility-Administered Medications  Medication Dose Route Frequency Provider Last Rate Last Dose  . ALPRAZolam Duanne Moron) tablet 0.25 mg  0.25 mg Oral QHS PRN Sherren Mocha, MD   0.25 mg at February 19, 2014 2221  . amLODipine (NORVASC) tablet 5 mg  5 mg Oral Daily Candee Furbish, MD   5 mg at 19-Feb-2014 1034  . aspirin EC tablet 81 mg  81 mg Oral Daily Sherren Mocha, MD   81 mg at February 19, 2014 1034  . atenolol (TENORMIN) tablet 25 mg  25 mg Oral Daily Candee Furbish, MD   25 mg at 2014-02-19 1034  . atorvastatin (LIPITOR) tablet 10 mg  10 mg Oral Daily Sherren Mocha, MD   10 mg at Feb 19, 2014 1034  . cholecalciferol (VITAMIN D) tablet 1,000 Units  1,000 Units Oral Daily Sherren Mocha, MD   1,000 Units at February 19, 2014 1034  . insulin aspart (novoLOG) injection 0-15 Units  0-15 Units Subcutaneous TID WC Sherren Mocha, MD      . levothyroxine (SYNTHROID, LEVOTHROID) tablet 75 mcg  75 mcg Oral QAC breakfast Sherren Mocha, MD   75 mcg at February 19, 2014 1034  . lisinopril (PRINIVIL,ZESTRIL) tablet 20 mg  20 mg Oral Daily Candee Furbish, MD   20 mg at 19-Feb-2014 1034  . nitroGLYCERIN (NITROSTAT) SL tablet 0.4 mg  0.4 mg Sublingual Q5 Min x 3 PRN Sherren Mocha, MD      . pantoprazole (PROTONIX) EC tablet 40 mg  40 mg Oral Daily Sherren Mocha, MD   40 mg at  19-Feb-2014 1034    PE: General appearance: alert, cooperative and no distress Lungs: faint bibasilar crackles that clear with deep cough Heart: regular rate and rhythm, S1, S2 normal, no murmur, click, rub or gallop Extremities: no LEE Pulses: 2+ and symmetric Skin: warm and dry Neurologic: Grossly normal  Lab Results:   Recent Labs  02/08/14 1518 19-Feb-2014 0315  WBC 8.6 8.6  HGB 12.8 12.4  HCT 36.5 35.7*  PLT 209 198   BMET  Recent Labs  02/08/14 1518 2014/02/19 0315  NA 137 139  K 3.7 3.5*  CL 94* 98  CO2 24 28  GLUCOSE 109* 117*  BUN 12 15  CREATININE 0.85 0.85  CALCIUM 10.0 9.5   PT/INR  Recent Labs  02/19/2014 0315  LABPROT 14.0  INR 1.10   Cholesterol  Recent Labs  2014/02/19 0315  CHOL 149   Cardiac Panel (last 3 results)  Recent Labs  02/08/14 2026 02/19/2014 0315 February 19, 2014 0745  TROPONINI <0.30 <0.30 <0.30    Studies/Results:  Diagnostic LHC 02/19/14 IMPRESSIONS:  1. Patent left main coronary artery. 2. Occluded mid left anterior descending artery. Patent LIMA to LAD. 3. Patent left circumflex artery and its branches. 4. Severely diseased mid right coronary artery. Radial artery to the distal RCA circulation is patent. It  originates from an anastomosis to the LIMA, and a Y graft pattern. 5. Normal left ventricular systolic function. LVEDP 16 mmHg. Ejection fraction 60 %.   Assessment/Plan  Active Problems:   Unstable angina pectoris   Old MI (myocardial infarction)   Coronary atherosclerosis of native coronary artery   Hx of CABG   Type II or unspecified type diabetes mellitus without mention of complication, not stated as uncontrolled   Pure hypercholesterolemia   Essential hypertension, benign  1) Chest Pain - Enzymes negative x 3 -Diagnostic LHC yesterday revealed patent left main, patient left circumflex, Patent LIMA-LAD and patent RA to RCA. Normal systolic function with EF of 60%.  Continue medical therapy. Right groin is stable.    2) CAD  - s/p CABG in 1999. LHC yesterday revealed patent grafts and normal EF. Continue medical therapy: ASA, BB, ACE and statin.  3) HTN  - elevated  - amlodipine added yesterday, as there is little room for upward titration of BB given she is borderline bradycardic.  Will also add isosorbide. Continue Amlodipine/HCTZ.   4) Hyperlipidemia  - continue with statin  - LDL 63 at goal.   5) DM  - controlled. A1c 6.4   Dispo: Home today. F/u in clinic in 1-2 weeks. Pt wishes to be followed by Dr. Burt Knack.    LOS: 2 days    Brittainy M. Rosita Fire, PA-C 02/10/2014 8:24 AM

## 2014-02-10 NOTE — Progress Notes (Signed)
Ready for DC. New amlodipine Start Imdur 30  Work on BP.  No PCI needed.   Follow up with Dr. Burt Knack in 7-14 days or APP in clinic. (Patient requests transfer to his team from her prior Cardiologist Dr. Daiva Huge).

## 2014-03-09 ENCOUNTER — Ambulatory Visit (INDEPENDENT_AMBULATORY_CARE_PROVIDER_SITE_OTHER): Payer: Medicare Other | Admitting: Physician Assistant

## 2014-03-09 ENCOUNTER — Encounter: Payer: Self-pay | Admitting: Physician Assistant

## 2014-03-09 VITALS — BP 140/77 | HR 61 | Ht 60.0 in | Wt 133.0 lb

## 2014-03-09 DIAGNOSIS — I2510 Atherosclerotic heart disease of native coronary artery without angina pectoris: Secondary | ICD-10-CM

## 2014-03-09 DIAGNOSIS — I10 Essential (primary) hypertension: Secondary | ICD-10-CM

## 2014-03-09 DIAGNOSIS — E7800 Pure hypercholesterolemia, unspecified: Secondary | ICD-10-CM

## 2014-03-09 MED ORDER — ATENOLOL 25 MG PO TABS: 25.0000 mg | ORAL_TABLET | Freq: Every day | ORAL | Status: DC

## 2014-03-09 MED ORDER — AMLODIPINE BESYLATE 5 MG PO TABS: 2.5000 mg | ORAL_TABLET | Freq: Two times a day (BID) | ORAL | Status: DC

## 2014-03-09 MED ORDER — AMLODIPINE BESYLATE 5 MG PO TABS: 2.5000 mg | ORAL_TABLET | Freq: Every day | ORAL | Status: DC

## 2014-03-09 NOTE — Patient Instructions (Addendum)
A REFILL FOR ATENOLOL WAS SENT IN TODAY  Your physician recommends that you schedule a follow-up appointment on 05/26/14 @ 10:15 WITH DR. HOCHREIN IN THE [place] OFFICE

## 2014-03-09 NOTE — Progress Notes (Signed)
Cardiology Office Note    Date:  03/09/2014   ID:  Ariel Rodriguez, DOB 06-Jun-1931, MRN [id number]  PCP:  Glenda Chroman., MD  Cardiologist:  Dr. Sherren Mocha  => Pt wants to see Dr. Minus Breeding in [place]    History of Present Illness: Ariel Rodriguez is a 80 y.o. female with a history of CAD, status post CABG, HTN, HL, diabetes. She underwent bypass surgery in 1999 at [facility] and was previously followed there (Dr. Daiva Huge).  She was admitted 6/1-6/3 with chest discomfort concerning for unstable angina. She noted that her symptoms were similar to her previous angina prior to her bypass. She r/o for myocardial infarction by enzymes. LHC demonstrated patent bypass grafts. Continued medical therapy was recommended. Amlodipine and isosorbide were both initiated (BP elevated).  She notes weakness and dizziness if her blood pressure gets to 140 or greater. She did have an episode where her blood pressure was in the 90s. She stopped her Norvasc and then resumed it at 2.5 mg a day. She is concerned that her blood pressure is creeping back up in the 140s. She generally feels better if her systolic blood pressures less than 140. She denies chest pain, shortness of breath, syncope. She denies orthopnea, PND or edema.   Studies:  - LHC (02/09/14):  Mid LAD occluded, patent circumflex, mid RCA sequential 90%, LIMA-LAD patent radial Y graft to distal RCA patent, EF 60%   Recent Labs: 02/08/2014: Pro B Natriuretic peptide (BNP) 146.6; TSH 3.860  02/09/2014: Creatinine 0.85; HDL Cholesterol by NMR 44; Hemoglobin 12.4; LDL (calc) 63; Potassium 3.5*   Wt Readings from Last 3 Encounters:  03/09/14 133 lb (60.328 kg)  02/09/14 133 lb 2.5 oz (60.399 kg)  02/09/14 133 lb 2.5 oz (60.399 kg)     Past Medical History  Diagnosis Date  . Diabetes mellitus without complication   . Hypertension   . Heart attack   . Coronary artery disease     Current Outpatient Prescriptions  Medication Sig  Dispense Refill  . ALPRAZolam (XANAX) 0.25 MG tablet Take 0.25 mg by mouth at bedtime as needed for anxiety.      Marland Kitchen amLODipine (NORVASC) 5 MG tablet Take 1 tablet (5 mg total) by mouth daily.  30 tablet  5  . aspirin 81 MG tablet Take 1 tablet (81 mg total) by mouth daily.      Marland Kitchen atenolol (TENORMIN) 25 MG tablet Take 1 tablet (25 mg total) by mouth daily. Takes one half daily  30 tablet  5  . atorvastatin (LIPITOR) 10 MG tablet Take 10 mg by mouth daily.      . Carboxymethylcellulose Sodium (REFRESH LIQUIGEL OP) Place 1 drop into both eyes at bedtime.      . cholecalciferol (VITAMIN D) 1000 UNITS tablet Take 1,000 Units by mouth daily.      . Cinnamon 500 MG capsule Take 500 mg by mouth every evening.      Marland Kitchen DHA-EPA-Coenzyme Q10-Vitamin E 120-180-50-30 CAPS Take 1 capsule by mouth daily.      . isosorbide mononitrate (IMDUR) 30 MG 24 hr tablet Take 1 tablet (30 mg total) by mouth daily.  30 tablet  5  . levothyroxine (SYNTHROID, LEVOTHROID) 75 MCG tablet Take 75 mcg by mouth daily before breakfast.      . lisinopril-hydrochlorothiazide (PRINZIDE,ZESTORETIC) 20-12.5 MG per tablet Take 1 tablet by mouth daily.       . metFORMIN (GLUCOPHAGE) 500 MG tablet Take 500 mg by mouth  2 (two) times daily with a meal.       . Multiple Vitamin (MULTIVITAMIN) tablet Take 1 tablet by mouth every evening.       Marland Kitchen NITROSTAT 0.4 MG SL tablet Place 0.4 mg under the tongue every 5 (five) minutes as needed for chest pain.       Marland Kitchen OVER THE COUNTER MEDICATION Apply 1 patch topically daily as needed (pain). Pain patch      . pantoprazole (PROTONIX) 40 MG tablet Take 40 mg by mouth daily.      . Polyvinyl Alcohol-Povidone (REFRESH OP) Place 1 drop into both eyes 2 (two) times daily as needed (dry eyes).       No current facility-administered medications for this visit.    Allergies:   Ciprofloxacin and Erythromycin   Social History:  The patient  reports that she has never smoked. She does not have any smokeless  tobacco history on file. She reports that she does not drink alcohol or use illicit drugs.   Family History:  The patient's family history is not on file.   ROS:  Please see the history of present illness.      All other systems reviewed and negative.   PHYSICAL EXAM: VS:  BP 140/77  Pulse 61  Ht 5' (1.524 m)  Wt 133 lb (60.328 kg)  BMI 25.97 kg/m2 Well nourished, well developed, in no acute distress HEENT: normal Neck: no JVD Cardiac:  normal S1, S2; RRR; no murmur Lungs:  clear to auscultation bilaterally, no wheezing, rhonchi or rales Abd: soft, nontender, no hepatomegaly Ext: no edemaright groin without hematoma or bruit  Skin: warm and dry Neuro:  CNs 2-12 intact, no focal abnormalities noted  EKG:  NSR, HR 61, LAD, anteroseptal Q waves, TWI in 1, aVL     ASSESSMENT AND PLAN:  1. CAD s/p CABG:  Stable anatomy on recent cardiac catheterization. Continue current therapy which consists of amlodipine, aspirin, beta blocker, statin, nitrates, ACE inhibitor. 2. Essential hypertension, benign:  Controlled. She feels better if her blood pressure is <284 systolic. Change amlodipine to 2.5 mg twice a day. She will continue to monitor blood pressures. 3. Pure hypercholesterolemia:  Continue statin. 4. Disposition: Follow up with Dr. Percival Spanish in [place]. Her husband is already a patient of Dr. Percival Spanish in [place] as well.   Signed, Versie Starks, MHS 03/09/2014 10:46 AM    [facility] [address], [place], [place]  [address] Phone: [phone]; Fax: [phone]

## 2014-03-10 ENCOUNTER — Telehealth: Payer: Self-pay | Admitting: Physician Assistant

## 2014-03-10 NOTE — Telephone Encounter (Signed)
ROI faxed to [facility] @ [phone]  7.1.15/km

## 2014-03-15 ENCOUNTER — Telehealth: Payer: Self-pay | Admitting: Physician Assistant

## 2014-03-15 NOTE — Telephone Encounter (Signed)
Records rec From [place] gave to operator Room 7.6.15/km

## 2014-05-26 ENCOUNTER — Ambulatory Visit (INDEPENDENT_AMBULATORY_CARE_PROVIDER_SITE_OTHER): Payer: Medicare Other | Admitting: Cardiology

## 2014-05-26 ENCOUNTER — Encounter: Payer: Self-pay | Admitting: Cardiology

## 2014-05-26 VITALS — BP 133/76 | HR 61 | Ht 60.0 in | Wt 131.0 lb

## 2014-05-26 DIAGNOSIS — I252 Old myocardial infarction: Secondary | ICD-10-CM

## 2014-05-26 DIAGNOSIS — I200 Unstable angina: Secondary | ICD-10-CM

## 2014-05-26 DIAGNOSIS — I2510 Atherosclerotic heart disease of native coronary artery without angina pectoris: Secondary | ICD-10-CM

## 2014-05-26 DIAGNOSIS — Z951 Presence of aortocoronary bypass graft: Secondary | ICD-10-CM

## 2014-05-26 DIAGNOSIS — I10 Essential (primary) hypertension: Secondary | ICD-10-CM

## 2014-05-26 MED ORDER — ISOSORBIDE MONONITRATE ER 30 MG PO TB24: 30.0000 mg | ORAL_TABLET | Freq: Every day | ORAL | Status: DC

## 2014-05-26 NOTE — Patient Instructions (Signed)
The current medical regimen is effective;  continue present plan and medications.  Follow up in 6 months with Dr. Percival Spanish in [place].  You will receive a letter in the mail 2 months before you are due.  Please call us when you receive this letter to schedule your follow up appointment.

## 2014-05-26 NOTE — Progress Notes (Signed)
HPI The patient presents for followup of known coronary disease with CABG. She was in the hospital in June. She had chest pain ruled out for myocardial infarction. She had patent bypass grafts and has been followed and is now here for her first visit with me.  She has had some difficult to control blood pressure.  She is switching her care to this office and she lives up here.  She stopped her Norvasc on her own because she felt poorly while taking this. She was wiped out. She's been keeping a blood pressure check since being off this medicine for several weeks and her blood pressures have been excellent. She feels much better. She says that since taking Imdur she is having none of the chest discomfort she was having. She walks up and down stairs frequently. However, she's not exercising as routinely as I would like.  The patient denies any new symptoms such as chest discomfort, neck or arm discomfort. There has been no new shortness of breath, PND or orthopnea. There have been no reported palpitations, presyncope or syncope.   Allergies  Allergen Reactions  . Ciprofloxacin Diarrhea and Nausea And Vomiting  . Erythromycin Diarrhea and Nausea And Vomiting    Current Outpatient Prescriptions  Medication Sig Dispense Refill  . ALPRAZolam (XANAX) 0.25 MG tablet Take 0.25 mg by mouth at bedtime as needed for anxiety.      Marland Kitchen aspirin 81 MG tablet Take 1 tablet (81 mg total) by mouth daily.      Marland Kitchen atenolol (TENORMIN) 25 MG tablet Take 1 tablet (25 mg total) by mouth daily. Takes one half daily  90 tablet  3  . atorvastatin (LIPITOR) 10 MG tablet Take 10 mg by mouth daily.      . Carboxymethylcellulose Sodium (REFRESH LIQUIGEL OP) Place 1 drop into both eyes at bedtime.      . cholecalciferol (VITAMIN D) 1000 UNITS tablet Take 1,000 Units by mouth daily.      . Cinnamon 500 MG capsule Take 500 mg by mouth every evening.      Marland Kitchen DHA-EPA-Coenzyme Q10-Vitamin E 120-180-50-30 CAPS Take 1 capsule by mouth  daily.      . isosorbide mononitrate (IMDUR) 30 MG 24 hr tablet Take 1 tablet (30 mg total) by mouth daily.  30 tablet  5  . levothyroxine (SYNTHROID, LEVOTHROID) 75 MCG tablet Take 75 mcg by mouth daily before breakfast.      . lisinopril-hydrochlorothiazide (PRINZIDE,ZESTORETIC) 20-12.5 MG per tablet Take 1 tablet by mouth daily.       . metFORMIN (GLUCOPHAGE) 500 MG tablet Take 500 mg by mouth 2 (two) times daily with a meal.       . Multiple Vitamin (MULTIVITAMIN) tablet Take 1 tablet by mouth every evening.       Marland Kitchen NITROSTAT 0.4 MG SL tablet Place 0.4 mg under the tongue every 5 (five) minutes as needed for chest pain.       Marland Kitchen OVER THE COUNTER MEDICATION Apply 1 patch topically daily as needed (pain). Pain patch      . pantoprazole (PROTONIX) 40 MG tablet Take 40 mg by mouth daily.      . Polyvinyl Alcohol-Povidone (REFRESH OP) Place 1 drop into both eyes 2 (two) times daily as needed (dry eyes).       No current facility-administered medications for this visit.    Past Medical History  Diagnosis Date  . Diabetes mellitus without complication   . Hypertension   . Heart  attack   . Coronary artery disease     Past Surgical History  Procedure Laterality Date  . Cardiac surgery      ROS:  As stated in the HPI and negative for all other systems.  PHYSICAL EXAM BP 133/76  Pulse 61  Ht 5' (1.524 m)  Wt 131 lb (59.421 kg)  BMI 25.58 kg/m2 GENERAL:  Well appearing HEENT:  Pupils equal round and reactive, fundi not visualized, oral mucosa unremarkable, this time he gains NECK:  No jugular venous distention, waveform within normal limits, carotid upstroke brisk and symmetric, no bruits, no thyromegaly LYMPHATICS:  No cervical, inguinal adenopathy LUNGS:  Clear to auscultation bilaterally BACK:  No CVA tenderness CHEST:  Stable mini thoracotomy HEART:  PMI not displaced or sustained,S1 and S2 within normal limits, no S3, no S4, no clicks, no rubs, no murmurs ABD:  Flat, positive  bowel sounds normal in frequency in pitch, no bruits, no rebound, no guarding, no midline pulsatile mass, no hepatomegaly, no splenomegaly EXT:  2 plus pulses throughout, no edema, no cyanosis no clubbing   EKG:  Sinus rhythm, rate 57, old inferior infarct, probable old inferior infarct, lateral T-wave inversions unchanged from previous.  05/26/2014   ASSESSMENT AND PLAN  CAD s/p CABG: Stable anatomy on recent cardiac catheterization.   She will continue with risk reduction.  I did review her Anatomy and report.  HTN:  Her blood pressure is controlled off of Norvasc and I reviewed her diary. She can continue the meds as listed.  DYSLIPIDEMIA:  Her HDL was 44 and LDL 63 in June. I reviewed his labs. She will remain on the meds as listed.  DM:  This is followed by VYAS,DHRUV B., MD.  A1C was 6.2.  She will remain on the meds as listed.

## 2014-06-29 ENCOUNTER — Ambulatory Visit (INDEPENDENT_AMBULATORY_CARE_PROVIDER_SITE_OTHER): Payer: Medicare Other

## 2014-06-29 DIAGNOSIS — Z23 Encounter for immunization: Secondary | ICD-10-CM

## 2014-08-19 ENCOUNTER — Encounter (HOSPITAL_COMMUNITY): Payer: Self-pay | Admitting: Interventional Cardiology

## 2014-12-22 ENCOUNTER — Ambulatory Visit (INDEPENDENT_AMBULATORY_CARE_PROVIDER_SITE_OTHER): Payer: Medicare Other | Admitting: Cardiology

## 2014-12-22 VITALS — BP 110/60 | HR 62 | Ht 60.0 in | Wt 137.0 lb

## 2014-12-22 DIAGNOSIS — I10 Essential (primary) hypertension: Secondary | ICD-10-CM | POA: Diagnosis not present

## 2014-12-22 NOTE — Patient Instructions (Signed)
The current medical regimen is effective;  continue present plan and medications.  Follow up in 1 year with Dr. Hochrein in [place].  You will receive a letter in the mail 2 months before you are due.  Please call us when you receive this letter to schedule your follow up appointment.  Thank you for choosing [facility]!!     

## 2014-12-22 NOTE — Progress Notes (Signed)
HPI The patient presents for followup of known coronary disease with CABG. She was in the hospital in June. She had chest pain ruled out for myocardial infarction. She had patent bypass grafts.  Since I last saw her she has done well.  The patient denies any new symptoms such as chest discomfort, neck or arm discomfort. There has been no new shortness of breath, PND or orthopnea. There have been no reported palpitations, presyncope or syncope.  She does housework but is not walking as much as I would like.  She owns a treadmill.  Allergies  Allergen Reactions  . Ciprofloxacin Diarrhea and Nausea And Vomiting  . Erythromycin Diarrhea and Nausea And Vomiting  . Prednisone Nausea Only    Current Outpatient Prescriptions  Medication Sig Dispense Refill  . ALPRAZolam (XANAX) 0.25 MG tablet Take 0.25 mg by mouth at bedtime as needed for anxiety.    Marland Kitchen aspirin 81 MG tablet Take 1 tablet (81 mg total) by mouth daily.    Marland Kitchen atenolol (TENORMIN) 25 MG tablet Take 1 tablet (25 mg total) by mouth daily. Takes one half daily 90 tablet 3  . atorvastatin (LIPITOR) 10 MG tablet Take 10 mg by mouth daily.    . cholecalciferol (VITAMIN D) 1000 UNITS tablet Take 1,000 Units by mouth daily.    . Cinnamon 500 MG capsule Take 500 mg by mouth every evening.    Marland Kitchen DHA-EPA-Coenzyme Q10-Vitamin E 120-180-50-30 CAPS Take 1 capsule by mouth daily.    . isosorbide mononitrate (IMDUR) 30 MG 24 hr tablet Take 1 tablet (30 mg total) by mouth daily. 90 tablet 3  . levothyroxine (SYNTHROID, LEVOTHROID) 75 MCG tablet Take 75 mcg by mouth daily before breakfast.    . lisinopril-hydrochlorothiazide (PRINZIDE,ZESTORETIC) 20-12.5 MG per tablet Take 1 tablet by mouth daily.     . metFORMIN (GLUCOPHAGE) 500 MG tablet Take 500 mg by mouth 2 (two) times daily with a meal.     . Multiple Vitamin (MULTIVITAMIN) tablet Take 1 tablet by mouth every evening.     Marland Kitchen NITROSTAT 0.4 MG SL tablet Place 0.4 mg under the tongue every 5 (five)  minutes as needed for chest pain.     . Polyvinyl Alcohol-Povidone (REFRESH OP) Place 1 drop into both eyes 2 (two) times daily as needed (dry eyes).     No current facility-administered medications for this visit.    Past Medical History  Diagnosis Date  . Diabetes mellitus without complication   . Hypertension   . Heart attack   . Coronary artery disease     LHC (02/09/14):  Mid LAD occluded, patent circumflex, mid RCA sequential 90%, LIMA-LAD patent radial Y graft to distal RCA patent, EF 60%    Past Surgical History  Procedure Laterality Date  . Cardiac surgery    . Left heart catheterization with coronary/graft angiogram N/A 02/09/2014    Procedure: LEFT HEART CATHETERIZATION WITH Beatrix Fetters;  Surgeon: Jettie Booze, MD;  Location: [facility] CATH LAB;  Service: Cardiovascular;  Laterality: N/A;    ROS:  As stated in the HPI and negative for all other systems.  PHYSICAL EXAM BP 110/60 mmHg  Pulse 62  Ht 5' (1.524 m)  Wt 137 lb (62.143 kg)  BMI 26.76 kg/m2 GENERAL:  Well appearing HEENT:  Pupils equal round and reactive, fundi not visualized, oral mucosa unremarkable, this time he gains NECK:  No jugular venous distention, waveform within normal limits, carotid upstroke brisk and symmetric, no bruits, no thyromegaly LYMPHATICS:  No cervical, inguinal adenopathy LUNGS:  Clear to auscultation bilaterally BACK:  No CVA tenderness CHEST:  Stable mini thoracotomy HEART:  PMI not displaced or sustained,S1 and S2 within normal limits, no S3, no S4, no clicks, no rubs, no murmurs ABD:  Flat, positive bowel sounds normal in frequency in pitch, no bruits, no rebound, no guarding, no midline pulsatile mass, no hepatomegaly, no splenomegaly EXT:  2 plus pulses throughout, no edema, no cyanosis no clubbing   EKG:  Sinus rhythm, rate 62, old inferior infarct, probable old anterior,  lateral T-wave inversions unchanged from previous.  12/22/2014   ASSESSMENT AND PLAN  CAD  s/p CABG: The patient has no new sypmtoms.  No further cardiovascular testing is indicated.  We will continue with aggressive risk reduction and meds as listed.  HTN:  The blood pressure is at target. No change in medications is indicated.   DYSLIPIDEMIA: Per Dr.Vyas  DM:  This is followed by No primary care provider on file.Marland Kitchen  A1C was 6.2.  She will remain on the meds as listed.

## 2014-12-23 ENCOUNTER — Encounter: Payer: Self-pay | Admitting: Cardiology

## 2015-05-26 ENCOUNTER — Other Ambulatory Visit: Payer: Self-pay | Admitting: *Deleted

## 2015-05-26 MED ORDER — ISOSORBIDE MONONITRATE ER 30 MG PO TB24
30.0000 mg | ORAL_TABLET | Freq: Every day | ORAL | Status: AC
Start: 2015-05-26 — End: ?

## 2015-07-04 ENCOUNTER — Ambulatory Visit (INDEPENDENT_AMBULATORY_CARE_PROVIDER_SITE_OTHER): Payer: Medicare Other

## 2015-07-04 DIAGNOSIS — Z23 Encounter for immunization: Secondary | ICD-10-CM | POA: Diagnosis not present

## 2015-10-25 DIAGNOSIS — J329 Chronic sinusitis, unspecified: Secondary | ICD-10-CM | POA: Diagnosis not present

## 2015-11-07 DIAGNOSIS — I10 Essential (primary) hypertension: Secondary | ICD-10-CM | POA: Diagnosis not present

## 2015-11-07 DIAGNOSIS — Z418 Encounter for other procedures for purposes other than remedying health state: Secondary | ICD-10-CM | POA: Diagnosis not present

## 2015-11-07 DIAGNOSIS — E119 Type 2 diabetes mellitus without complications: Secondary | ICD-10-CM | POA: Diagnosis not present

## 2016-01-05 DIAGNOSIS — H2513 Age-related nuclear cataract, bilateral: Secondary | ICD-10-CM | POA: Diagnosis not present

## 2016-01-05 DIAGNOSIS — E119 Type 2 diabetes mellitus without complications: Secondary | ICD-10-CM | POA: Diagnosis not present

## 2016-01-19 DIAGNOSIS — Z1231 Encounter for screening mammogram for malignant neoplasm of breast: Secondary | ICD-10-CM | POA: Diagnosis not present

## 2016-01-25 ENCOUNTER — Encounter: Payer: Self-pay | Admitting: Cardiology

## 2016-01-25 DIAGNOSIS — Z1389 Encounter for screening for other disorder: Secondary | ICD-10-CM | POA: Diagnosis not present

## 2016-01-25 DIAGNOSIS — Z79899 Other long term (current) drug therapy: Secondary | ICD-10-CM | POA: Diagnosis not present

## 2016-01-25 DIAGNOSIS — Z0000 Encounter for general adult medical examination without abnormal findings: Secondary | ICD-10-CM | POA: Diagnosis not present

## 2016-01-25 DIAGNOSIS — E7800 Pure hypercholesterolemia, unspecified: Secondary | ICD-10-CM | POA: Diagnosis not present

## 2016-01-25 DIAGNOSIS — Z7189 Other specified counseling: Secondary | ICD-10-CM | POA: Diagnosis not present

## 2016-01-25 DIAGNOSIS — Z6826 Body mass index (BMI) 26.0-26.9, adult: Secondary | ICD-10-CM | POA: Diagnosis not present

## 2016-01-25 DIAGNOSIS — Z1211 Encounter for screening for malignant neoplasm of colon: Secondary | ICD-10-CM | POA: Diagnosis not present

## 2016-01-25 DIAGNOSIS — Z299 Encounter for prophylactic measures, unspecified: Secondary | ICD-10-CM | POA: Diagnosis not present

## 2016-01-25 DIAGNOSIS — R5383 Other fatigue: Secondary | ICD-10-CM | POA: Diagnosis not present

## 2016-01-25 DIAGNOSIS — Z789 Other specified health status: Secondary | ICD-10-CM | POA: Diagnosis not present

## 2016-01-30 ENCOUNTER — Encounter: Payer: Self-pay | Admitting: Cardiology

## 2016-02-01 ENCOUNTER — Encounter: Payer: Self-pay | Admitting: Cardiology

## 2016-02-01 ENCOUNTER — Ambulatory Visit (INDEPENDENT_AMBULATORY_CARE_PROVIDER_SITE_OTHER): Payer: PPO | Admitting: Cardiology

## 2016-02-01 VITALS — BP 140/78 | HR 64 | Ht 60.0 in | Wt 135.0 lb

## 2016-02-01 DIAGNOSIS — I10 Essential (primary) hypertension: Secondary | ICD-10-CM | POA: Diagnosis not present

## 2016-02-01 MED ORDER — NITROGLYCERIN 0.4 MG SL SUBL: 0.4000 mg | SUBLINGUAL_TABLET | SUBLINGUAL | Status: DC | PRN

## 2016-02-01 NOTE — Progress Notes (Signed)
HPI The patient presents for followup of known coronary disease with CABG in 1999. She was in the hospital in June 2015. She had chest pain ruled out for myocardial infarction. She had patent bypass grafts on cath.  Since I last saw her she has done well.  She continues to get the back pain which has been her chest pain equivalent in the past.  She actually thinks that this is better than it was in 2015.  The patient denies any new symptoms such as chest discomfort, neck or arm discomfort. There has been no new shortness of breath, PND or orthopnea. There have been no reported palpitations, presyncope or syncope.  She does do housework and some walking.   Allergies  Allergen Reactions  . Ciprofloxacin Diarrhea and Nausea And Vomiting  . Erythromycin Diarrhea and Nausea And Vomiting  . Prednisone Nausea Only    Current Outpatient Prescriptions  Medication Sig Dispense Refill  . ALPRAZolam (XANAX) 0.25 MG tablet Take 0.25 mg by mouth at bedtime as needed for anxiety.    Marland Kitchen aspirin 81 MG tablet Take 1 tablet (81 mg total) by mouth daily.    Marland Kitchen atenolol (TENORMIN) 25 MG tablet Take 1 tablet (25 mg total) by mouth daily. Takes one half daily 90 tablet 3  . atorvastatin (LIPITOR) 10 MG tablet Take 10 mg by mouth daily.    . cholecalciferol (VITAMIN D) 1000 UNITS tablet Take 1,000 Units by mouth daily.    . Cinnamon 500 MG capsule Take 500 mg by mouth every evening.    Marland Kitchen DHA-EPA-Coenzyme Q10-Vitamin E 120-180-50-30 CAPS Take 1 capsule by mouth daily.    . isosorbide mononitrate (IMDUR) 30 MG 24 hr tablet Take 1 tablet (30 mg total) by mouth daily. 90 tablet 1  . levothyroxine (SYNTHROID, LEVOTHROID) 75 MCG tablet Take 75 mcg by mouth daily before breakfast.    . lisinopril-hydrochlorothiazide (PRINZIDE,ZESTORETIC) 20-12.5 MG per tablet Take 1 tablet by mouth daily.     . metFORMIN (GLUCOPHAGE) 500 MG tablet Take 500 mg by mouth 2 (two) times daily with a meal.     . Multiple Vitamin  (MULTIVITAMIN) tablet Take 1 tablet by mouth every evening.     Marland Kitchen NITROSTAT 0.4 MG SL tablet Place 0.4 mg under the tongue every 5 (five) minutes as needed for chest pain.     . pantoprazole (PROTONIX) 40 MG tablet Take 40 mg by mouth daily.     . Polyvinyl Alcohol-Povidone (REFRESH OP) Place 1 drop into both eyes 2 (two) times daily as needed (dry eyes).     No current facility-administered medications for this visit.    Past Medical History  Diagnosis Date  . Diabetes mellitus without complication ([place])   . Hypertension   . Heart attack ([place])   . Coronary artery disease     LHC (02/09/14):  Mid LAD occluded, patent circumflex, mid RCA sequential 90%, LIMA-LAD patent radial Y graft to distal RCA patent, EF 60%    Past Surgical History  Procedure Laterality Date  . Cardiac surgery    . Left heart catheterization with coronary/graft angiogram N/A 02/09/2014    Procedure: LEFT HEART CATHETERIZATION WITH Beatrix Fetters;  Surgeon: Jettie Booze, MD;  Location: [facility] CATH LAB;  Service: Cardiovascular;  Laterality: N/A;    ROS:  As stated in the HPI and negative for all other systems.  PHYSICAL EXAM BP 140/78 mmHg  Pulse 64  Ht 5' (1.524 m)  Wt 135 lb (61.236 kg)  BMI 26.37 kg/m2 GENERAL:  Well appearing HEENT:  Pupils equal round and reactive, fundi not visualized, oral mucosa unremarkable, this time he gains NECK:  No jugular venous distention, waveform within normal limits, carotid upstroke brisk and symmetric, no bruits, no thyromegaly LYMPHATICS:  No cervical, inguinal adenopathy LUNGS:  Clear to auscultation bilaterally BACK:  No CVA tenderness CHEST:  Stable mini thoracotomy HEART:  PMI not displaced or sustained,S1 and S2 within normal limits, no S3, no S4, no clicks, no rubs, no murmurs ABD:  Flat, positive bowel sounds normal in frequency in pitch, no bruits, no rebound, no guarding, no midline pulsatile mass, no hepatomegaly, no splenomegaly EXT:  2 plus pulses  throughout, no edema, no cyanosis no clubbing   EKG:  Sinus rhythm, rate 64, old inferior infarct, probable old anterior,  lateral T-wave inversions unchanged from previous.  02/01/2016   ASSESSMENT AND PLAN  CAD s/p CABG: The patient has no new sypmtoms.  No further cardiovascular testing is indicated.  We will continue with aggressive risk reduction and meds as listed.  HTN:  The blood pressure is at target. No change in medications is indicated.   DYSLIPIDEMIA:   Her LDL recently was 63 with an HDL of 46.  She will remain on the meds as listed.   DM:  This is followed by Woody Seller.

## 2016-02-01 NOTE — Patient Instructions (Signed)

## 2016-02-14 DIAGNOSIS — E1165 Type 2 diabetes mellitus with hyperglycemia: Secondary | ICD-10-CM | POA: Diagnosis not present

## 2016-02-14 DIAGNOSIS — I10 Essential (primary) hypertension: Secondary | ICD-10-CM | POA: Diagnosis not present

## 2016-02-14 DIAGNOSIS — Z299 Encounter for prophylactic measures, unspecified: Secondary | ICD-10-CM | POA: Diagnosis not present

## 2016-03-07 DIAGNOSIS — E119 Type 2 diabetes mellitus without complications: Secondary | ICD-10-CM | POA: Diagnosis not present

## 2016-03-07 DIAGNOSIS — I10 Essential (primary) hypertension: Secondary | ICD-10-CM | POA: Diagnosis not present

## 2016-03-07 DIAGNOSIS — I2510 Atherosclerotic heart disease of native coronary artery without angina pectoris: Secondary | ICD-10-CM | POA: Diagnosis not present

## 2016-03-19 DIAGNOSIS — I10 Essential (primary) hypertension: Secondary | ICD-10-CM | POA: Diagnosis not present

## 2016-03-19 DIAGNOSIS — E1165 Type 2 diabetes mellitus with hyperglycemia: Secondary | ICD-10-CM | POA: Diagnosis not present

## 2016-03-19 DIAGNOSIS — F419 Anxiety disorder, unspecified: Secondary | ICD-10-CM | POA: Diagnosis not present

## 2016-03-19 DIAGNOSIS — Z299 Encounter for prophylactic measures, unspecified: Secondary | ICD-10-CM | POA: Diagnosis not present

## 2016-03-28 DIAGNOSIS — E119 Type 2 diabetes mellitus without complications: Secondary | ICD-10-CM | POA: Diagnosis not present

## 2016-03-28 DIAGNOSIS — I10 Essential (primary) hypertension: Secondary | ICD-10-CM | POA: Diagnosis not present

## 2016-03-28 DIAGNOSIS — I2510 Atherosclerotic heart disease of native coronary artery without angina pectoris: Secondary | ICD-10-CM | POA: Diagnosis not present

## 2016-03-29 DIAGNOSIS — I2510 Atherosclerotic heart disease of native coronary artery without angina pectoris: Secondary | ICD-10-CM | POA: Diagnosis not present

## 2016-03-29 DIAGNOSIS — I10 Essential (primary) hypertension: Secondary | ICD-10-CM | POA: Diagnosis not present

## 2016-04-12 DIAGNOSIS — I10 Essential (primary) hypertension: Secondary | ICD-10-CM | POA: Diagnosis not present

## 2016-04-12 DIAGNOSIS — R195 Other fecal abnormalities: Secondary | ICD-10-CM | POA: Diagnosis not present

## 2016-04-12 DIAGNOSIS — Z86010 Personal history of colonic polyps: Secondary | ICD-10-CM | POA: Diagnosis not present

## 2016-04-21 ENCOUNTER — Encounter (HOSPITAL_COMMUNITY): Payer: Self-pay | Admitting: Emergency Medicine

## 2016-04-21 ENCOUNTER — Emergency Department (HOSPITAL_COMMUNITY)
Admission: EM | Admit: 2016-04-21 | Discharge: 2016-04-22 | Disposition: A | Discharge status: Home / Self Care | Payer: PPO | Attending: Emergency Medicine | Admitting: Emergency Medicine

## 2016-04-21 DIAGNOSIS — Z79899 Other long term (current) drug therapy: Secondary | ICD-10-CM | POA: Insufficient documentation

## 2016-04-21 DIAGNOSIS — Z7982 Long term (current) use of aspirin: Secondary | ICD-10-CM | POA: Insufficient documentation

## 2016-04-21 DIAGNOSIS — M7989 Other specified soft tissue disorders: Secondary | ICD-10-CM | POA: Diagnosis not present

## 2016-04-21 DIAGNOSIS — E871 Hypo-osmolality and hyponatremia: Secondary | ICD-10-CM

## 2016-04-21 DIAGNOSIS — R0602 Shortness of breath: Secondary | ICD-10-CM | POA: Insufficient documentation

## 2016-04-21 DIAGNOSIS — I2510 Atherosclerotic heart disease of native coronary artery without angina pectoris: Secondary | ICD-10-CM | POA: Insufficient documentation

## 2016-04-21 DIAGNOSIS — E119 Type 2 diabetes mellitus without complications: Secondary | ICD-10-CM | POA: Insufficient documentation

## 2016-04-21 DIAGNOSIS — R531 Weakness: Secondary | ICD-10-CM | POA: Diagnosis not present

## 2016-04-21 DIAGNOSIS — R05 Cough: Secondary | ICD-10-CM | POA: Diagnosis not present

## 2016-04-21 DIAGNOSIS — Z7984 Long term (current) use of oral hypoglycemic drugs: Secondary | ICD-10-CM | POA: Insufficient documentation

## 2016-04-21 DIAGNOSIS — M549 Dorsalgia, unspecified: Secondary | ICD-10-CM | POA: Insufficient documentation

## 2016-04-21 DIAGNOSIS — R110 Nausea: Secondary | ICD-10-CM | POA: Diagnosis not present

## 2016-04-21 DIAGNOSIS — I10 Essential (primary) hypertension: Secondary | ICD-10-CM | POA: Insufficient documentation

## 2016-04-21 LAB — COMPREHENSIVE METABOLIC PANEL
ALBUMIN: 4.7 g/dL (ref 3.5–5.0)
ALT: 54 U/L (ref 14–54)
AST: 43 U/L — AB (ref 15–41)
Alkaline Phosphatase: 44 U/L (ref 38–126)
Anion gap: 11 (ref 5–15)
BUN: 10 mg/dL (ref 6–20)
CO2: 26 mmol/L (ref 22–32)
CREATININE: 0.71 mg/dL (ref 0.44–1.00)
Calcium: 9.3 mg/dL (ref 8.9–10.3)
Chloride: 87 mmol/L — ABNORMAL LOW (ref 101–111)
Glucose, Bld: 108 mg/dL — ABNORMAL HIGH (ref 65–99)
Potassium: 3.6 mmol/L (ref 3.5–5.1)
Sodium: 124 mmol/L — ABNORMAL LOW (ref 135–145)
Total Bilirubin: 0.9 mg/dL (ref 0.3–1.2)
Total Protein: 8 g/dL (ref 6.5–8.1)

## 2016-04-21 LAB — CBC WITH DIFFERENTIAL/PLATELET
Basophils Absolute: 0.1 10*3/uL (ref 0.0–0.1)
Basophils Relative: 1 %
Eosinophils Absolute: 0.4 10*3/uL (ref 0.0–0.7)
Eosinophils Relative: 4 %
HCT: 38.1 % (ref 36.0–46.0)
HEMOGLOBIN: 13.6 g/dL (ref 12.0–15.0)
Lymphocytes Relative: 26 %
Lymphs Abs: 2.3 10*3/uL (ref 0.7–4.0)
MCH: 31.6 pg (ref 26.0–34.0)
MCHC: 35.7 g/dL (ref 30.0–36.0)
MCV: 88.6 fL (ref 78.0–100.0)
Monocytes Absolute: 1 10*3/uL (ref 0.1–1.0)
Monocytes Relative: 11 %
NEUTROS PCT: 58 %
Neutro Abs: 5.2 10*3/uL (ref 1.7–7.7)
Platelets: 223 10*3/uL (ref 150–400)
RBC: 4.3 MIL/uL (ref 3.87–5.11)
RDW: 12.1 % (ref 11.5–15.5)
WBC: 8.9 10*3/uL (ref 4.0–10.5)

## 2016-04-21 LAB — URINALYSIS, ROUTINE W REFLEX MICROSCOPIC
Bilirubin Urine: NEGATIVE
GLUCOSE, UA: NEGATIVE mg/dL
HGB URINE DIPSTICK: NEGATIVE
Ketones, ur: NEGATIVE mg/dL
Leukocytes, UA: NEGATIVE
Nitrite: NEGATIVE
PROTEIN: NEGATIVE mg/dL
Specific Gravity, Urine: 1.01 (ref 1.005–1.030)
pH: 7.5 (ref 5.0–8.0)

## 2016-04-21 LAB — LIPASE, BLOOD: LIPASE: 35 U/L (ref 11–51)

## 2016-04-21 LAB — TROPONIN I: Troponin I: <0.03 ng/mL (ref <0.03)

## 2016-04-21 MED ORDER — SODIUM CHLORIDE 0.9 % IV BOLUS (SEPSIS)
500.0000 mL | Freq: Once | INTRAVENOUS | Status: AC
  Administered 2016-04-21: 500 mL via INTRAVENOUS

## 2016-04-21 MED ORDER — ONDANSETRON 4 MG PO TBDP: 4.0000 mg | ORAL_TABLET | Freq: Three times a day (TID) | ORAL | 0 refills | Status: DC | PRN

## 2016-04-21 MED ORDER — ONDANSETRON HCL 4 MG/2ML IJ SOLN
4.0000 mg | Freq: Once | INTRAMUSCULAR | Status: AC
  Administered 2016-04-21: 4 mg via INTRAVENOUS
  Filled 2016-04-21: qty 2

## 2016-04-21 NOTE — ED Notes (Signed)
Patient assisted to restroom with hands on assistance. Patient states that she felt dizzy upon standing.

## 2016-04-21 NOTE — ED Provider Notes (Signed)
[facility] DEPT Provider Note   CSN: [id number] Arrival date & time: 04/21/16  2011  First Provider Contact:  First MD Initiated Contact with Patient 04/21/16 2051     By signing my name below, I, Ariel Rodriguez, attest that this documentation has been prepared under the direction and in the presence of Ariel Chapel, MD. Electronically Signed: Hansel Rodriguez, ED Scribe. 04/21/16. 9:00 PM.     History   Chief Complaint Chief Complaint  Patient presents with  . Nausea    HPI Ariel Rodriguez is a 82 y.o. female with h/o CAD, DM, HTN who presents to the Emergency Department complaining of moderate, persistent nausea onset 4 days ago with associated generalized weakness. She also reports 2 episodes of diarrhea this morning, but denies any since. She reports she has been able to tolerate food and fluids, ate a full breakfast of cheerios, milk and fruit this morning, and a baked potato this evening. She also complains of occasional cough, SOB and leg swelling. Pt also reports that she has chronic back pain, but this has not worsened with her current symptoms. She started Norvasc 2.5 mg, and increased her doses of Atenolol and Lisinopril 9 days ago. No known recent tick or mosquito bites.  Pt denies emesis, difficulty urinating, dysuria, frequency, hematuria, cloudy or malodorous urine. She has h/o cardiac cath 2 years ago with no findings of blockages.  The history is provided by the patient. No language interpreter was used.   Past Medical History:  Diagnosis Date  . Coronary artery disease    LHC (02/09/14):  Mid LAD occluded, patent circumflex, mid RCA sequential 90%, LIMA-LAD patent radial Y graft to distal RCA patent, EF 60%  . Diabetes mellitus without complication ([place])   . Heart attack ([place])   . Hypertension     Patient Active Problem List   Diagnosis Date Noted  . Old MI (myocardial infarction) 02/09/2014  . Coronary atherosclerosis of native coronary artery 02/09/2014  . Hx of  CABG 02/09/2014  . Type II or unspecified type diabetes mellitus without mention of complication, not stated as uncontrolled 02/09/2014  . Pure hypercholesterolemia 02/09/2014  . Essential hypertension, benign 02/09/2014  . Unstable angina pectoris ([place]) 02/08/2014    Past Surgical History:  Procedure Laterality Date  . CARDIAC SURGERY    . LEFT HEART CATHETERIZATION WITH CORONARY/GRAFT ANGIOGRAM N/A 02/09/2014   Procedure: LEFT HEART CATHETERIZATION WITH Beatrix Fetters;  Surgeon: Jettie Booze, MD;  Location: [facility] CATH LAB;  Service: Cardiovascular;  Laterality: N/A;    OB History    No data available       Home Medications    Prior to Admission medications   Medication Sig Start Date End Date Taking? Authorizing Provider  ALPRAZolam Duanne Moron) 0.25 MG tablet Take 0.25 mg by mouth at bedtime as needed for anxiety or sleep.    Yes Historical Provider, MD  aspirin EC 81 MG tablet Take 81 mg by mouth daily.   Yes Historical Provider, MD  atenolol (TENORMIN) 50 MG tablet Take 50 mg by mouth every evening.   Yes Historical Provider, MD  atorvastatin (LIPITOR) 10 MG tablet Take 10 mg by mouth every evening.    Yes Historical Provider, MD  cholecalciferol (VITAMIN D) 1000 UNITS tablet Take 1,000 Units by mouth daily.   Yes Historical Provider, MD  Cinnamon 500 MG capsule Take 500 mg by mouth every evening.   Yes Historical Provider, MD  isosorbide mononitrate (IMDUR) 30 MG 24 hr tablet Take 1  tablet (30 mg total) by mouth daily. 05/26/15  Yes Minus Breeding, MD  levothyroxine (SYNTHROID, LEVOTHROID) 75 MCG tablet Take 75 mcg by mouth daily before breakfast.   Yes Historical Provider, MD  lisinopril-hydrochlorothiazide (PRINZIDE,ZESTORETIC) 20-12.5 MG per tablet Take 0.5-1 tablets by mouth 2 (two) times daily. Pt takes one tablet in the morning and one-half tablet at night.   Yes Historical Provider, MD  metFORMIN (GLUCOPHAGE) 500 MG tablet Take 500-1,000 mg by mouth 2 (two) times  daily with a meal. Pt takes two tablets in the morning and one at night.   Yes Historical Provider, MD  Multiple Vitamin (MULTIVITAMIN WITH MINERALS) TABS tablet Take 1 tablet by mouth daily.   Yes Historical Provider, MD  nitroGLYCERIN (NITROSTAT) 0.4 MG SL tablet Place 1 tablet (0.4 mg total) under the tongue every 5 (five) minutes as needed for chest pain. 02/01/16  Yes Minus Breeding, MD  Omega-3 Fatty Acids (KP OMEGA-3 FISH OIL) 1200 MG CAPS Take 1,200 mg by mouth daily.   Yes Historical Provider, MD  pantoprazole (PROTONIX) 40 MG tablet Take 40 mg by mouth daily.    Yes Historical Provider, MD  ondansetron (ZOFRAN ODT) 4 MG disintegrating tablet Take 1 tablet (4 mg total) by mouth every 8 (eight) hours as needed for nausea. 04/21/16   Ariel Chapel, MD    Family History No family history on file.  Social History Social History  Substance Use Topics  . Smoking status: Never Smoker  . Smokeless tobacco: Never Used  . Alcohol use No     Allergies   Ciprofloxacin; Erythromycin; and Prednisone   Review of Systems Review of Systems  Respiratory: Positive for cough and shortness of breath.   Cardiovascular: Positive for leg swelling.  Gastrointestinal: Positive for nausea. Negative for vomiting.  Genitourinary: Negative for difficulty urinating, dysuria, frequency and hematuria.  Musculoskeletal: Positive for back pain (chronic).  Neurological: Positive for weakness (generalized).  All other systems reviewed and are negative.    Physical Exam Updated Vital Signs BP 152/60   Pulse (!) 55   Temp 98 F (36.7 C) (Oral)   Resp 13   Ht 5' (1.524 m)   Wt 131 lb (59.4 kg)   SpO2 98%   BMI 25.58 kg/m   Physical Exam  Constitutional: She appears well-developed and well-nourished. No distress.  HENT:  Head: Normocephalic and atraumatic.  Mouth/Throat: Oropharynx is clear and moist. No oropharyngeal exudate.  Eyes: Conjunctivae and EOM are normal. Pupils are equal, round, and  reactive to light. Right eye exhibits no discharge. Left eye exhibits no discharge. No scleral icterus.  Neck: Normal range of motion. Neck supple. No JVD present. No thyromegaly present.  Cardiovascular: Normal rate, regular rhythm, normal heart sounds and intact distal pulses.  Exam reveals no gallop and no friction rub.   No murmur heard. Pulmonary/Chest: Effort normal and breath sounds normal. No respiratory distress. She has no wheezes. She has no rales.  Abdominal: Soft. Bowel sounds are normal. She exhibits no distension and no mass. There is tenderness. There is no rebound and no guarding.  Minimal tenderness to bilateral lower quadrants; even less so to bilateral upper quadrants. No peritoneal signs.   Musculoskeletal: Normal range of motion. She exhibits no edema or tenderness.  Lymphadenopathy:    She has no cervical adenopathy.  Neurological: She is alert. Coordination normal.  Skin: Skin is warm and dry. No rash noted. No erythema.  Psychiatric: She has a normal mood and affect. Her behavior is normal.  Nursing note and vitals reviewed.    ED Treatments / Results  Labs (all labs ordered are listed, but only abnormal results are displayed) Labs Reviewed  COMPREHENSIVE METABOLIC PANEL - Abnormal; Notable for the following:       Result Value   Sodium 124 (*)    Chloride 87 (*)    Glucose, Bld 108 (*)    AST 43 (*)    All other components within normal limits  CBC WITH DIFFERENTIAL/PLATELET  LIPASE, BLOOD  URINALYSIS, ROUTINE W REFLEX MICROSCOPIC (NOT AT [facility])  TROPONIN I    EKG  EKG Interpretation  Date/Time:  Saturday April 21 2016 21:16:12 EDT Ventricular Rate:  59 PR Interval:    QRS Duration: 94 QT Interval:  411 QTC Calculation: 408 R Axis:   -45 Text Interpretation:  Sinus or ectopic atrial rhythm Probable inferior infarct, recent Anterior infarct, old Lateral leads are also involved since last tracing no significant change Confirmed by Jonatan Wilsey  MD, Sohaib Vereen  [phone]) on 04/21/2016 11:08:53 PM       Radiology No results found.  Procedures Procedures (including critical care time)  DIAGNOSTIC STUDIES: Oxygen Saturation is 99% on RA, normal by my interpretation.    COORDINATION OF CARE: 8:57 PM Discussed treatment plan with pt at bedside which includes lab work and pt agreed to plan.    Medications Ordered in ED Medications  sodium chloride 0.9 % bolus 500 mL (not administered)  ondansetron (ZOFRAN) injection 4 mg (4 mg Intravenous Given 04/21/16 2128)  sodium chloride 0.9 % bolus 500 mL (0 mLs Intravenous Stopped 04/21/16 2214)     Initial Impression / Assessment and Plan / ED Course  I have reviewed the triage vital signs and the nursing notes.  Pertinent labs & imaging results that were available during my care of the patient were reviewed by me and considered in my medical decision making (see chart for details).  Clinical Course  Comment By Time  Pt agreeable to f/u on Monday - no nausea at this time - normal neuro exam - VS unremarkable Told to stop amlodipine - expressed undersetanding Now states that she had hyponatremia in the past as well - unexplained and resolved with oral supplementation. Ariel Chapel, MD 08/12 2342    Labs noraml other than Na, pt informed - fluids given , zofran with complete resolution of sx.  Final Clinical Impressions(s) / ED Diagnoses   Final diagnoses:  Hyponatremia  Nausea    New Prescriptions New Prescriptions   ONDANSETRON (ZOFRAN ODT) 4 MG DISINTEGRATING TABLET    Take 1 tablet (4 mg total) by mouth every 8 (eight) hours as needed for nausea.    I personally performed the services described in this documentation, which was scribed in my presence. The recorded information has been reviewed and is accurate.      Ariel Chapel, MD 04/21/16 [phone]

## 2016-04-21 NOTE — Discharge Instructions (Signed)
Please salt your food heavily over the next 3 days  You MUST have your sodium level rechecked on Monday or Tuesday - call your doctor in the morning to have this test repeated  Zofran for nausea

## 2016-04-21 NOTE — ED Triage Notes (Signed)
Pt with c/o nausea since Wednesday. Denies vomiting. States her bowels were "thin" this morning but denies diarrhea.

## 2016-04-22 NOTE — ED Notes (Signed)
Patient verbalizes understanding of discharge instructions, prescriptions, home care and follow up care. Patient out of department at this time. 

## 2016-04-24 DIAGNOSIS — E1165 Type 2 diabetes mellitus with hyperglycemia: Secondary | ICD-10-CM | POA: Diagnosis not present

## 2016-04-24 DIAGNOSIS — Z299 Encounter for prophylactic measures, unspecified: Secondary | ICD-10-CM | POA: Diagnosis not present

## 2016-04-24 DIAGNOSIS — F419 Anxiety disorder, unspecified: Secondary | ICD-10-CM | POA: Diagnosis not present

## 2016-04-24 DIAGNOSIS — E871 Hypo-osmolality and hyponatremia: Secondary | ICD-10-CM | POA: Diagnosis not present

## 2016-04-24 DIAGNOSIS — I10 Essential (primary) hypertension: Secondary | ICD-10-CM | POA: Diagnosis not present

## 2016-04-27 DIAGNOSIS — E119 Type 2 diabetes mellitus without complications: Secondary | ICD-10-CM | POA: Diagnosis not present

## 2016-04-27 DIAGNOSIS — I10 Essential (primary) hypertension: Secondary | ICD-10-CM | POA: Diagnosis not present

## 2016-04-27 DIAGNOSIS — I2510 Atherosclerotic heart disease of native coronary artery without angina pectoris: Secondary | ICD-10-CM | POA: Diagnosis not present

## 2016-05-03 DIAGNOSIS — K8000 Calculus of gallbladder with acute cholecystitis without obstruction: Secondary | ICD-10-CM | POA: Diagnosis not present

## 2016-05-03 DIAGNOSIS — Z886 Allergy status to analgesic agent status: Secondary | ICD-10-CM | POA: Diagnosis not present

## 2016-05-03 DIAGNOSIS — E119 Type 2 diabetes mellitus without complications: Secondary | ICD-10-CM | POA: Diagnosis not present

## 2016-05-03 DIAGNOSIS — R110 Nausea: Secondary | ICD-10-CM | POA: Diagnosis not present

## 2016-05-03 DIAGNOSIS — Z888 Allergy status to other drugs, medicaments and biological substances status: Secondary | ICD-10-CM | POA: Diagnosis not present

## 2016-05-03 DIAGNOSIS — I10 Essential (primary) hypertension: Secondary | ICD-10-CM | POA: Diagnosis not present

## 2016-05-03 DIAGNOSIS — Z833 Family history of diabetes mellitus: Secondary | ICD-10-CM | POA: Diagnosis not present

## 2016-05-03 DIAGNOSIS — E871 Hypo-osmolality and hyponatremia: Secondary | ICD-10-CM | POA: Diagnosis not present

## 2016-05-03 DIAGNOSIS — Z808 Family history of malignant neoplasm of other organs or systems: Secondary | ICD-10-CM | POA: Diagnosis not present

## 2016-05-03 DIAGNOSIS — K828 Other specified diseases of gallbladder: Secondary | ICD-10-CM | POA: Diagnosis not present

## 2016-05-03 DIAGNOSIS — Z881 Allergy status to other antibiotic agents status: Secondary | ICD-10-CM | POA: Diagnosis not present

## 2016-05-03 DIAGNOSIS — I252 Old myocardial infarction: Secondary | ICD-10-CM | POA: Diagnosis not present

## 2016-05-04 DIAGNOSIS — K8012 Calculus of gallbladder with acute and chronic cholecystitis without obstruction: Secondary | ICD-10-CM | POA: Diagnosis not present

## 2016-05-04 DIAGNOSIS — E119 Type 2 diabetes mellitus without complications: Secondary | ICD-10-CM | POA: Diagnosis not present

## 2016-05-04 DIAGNOSIS — K8080 Other cholelithiasis without obstruction: Secondary | ICD-10-CM | POA: Diagnosis not present

## 2016-05-04 DIAGNOSIS — K810 Acute cholecystitis: Secondary | ICD-10-CM | POA: Diagnosis not present

## 2016-05-04 HISTORY — PX: LAPAROSCOPIC CHOLECYSTECTOMY: SUR755

## 2016-05-05 DIAGNOSIS — K828 Other specified diseases of gallbladder: Secondary | ICD-10-CM | POA: Diagnosis not present

## 2016-05-05 DIAGNOSIS — E871 Hypo-osmolality and hyponatremia: Secondary | ICD-10-CM | POA: Diagnosis not present

## 2016-05-05 DIAGNOSIS — K8000 Calculus of gallbladder with acute cholecystitis without obstruction: Secondary | ICD-10-CM | POA: Diagnosis not present

## 2016-05-05 DIAGNOSIS — I10 Essential (primary) hypertension: Secondary | ICD-10-CM | POA: Diagnosis not present

## 2016-05-06 DIAGNOSIS — K8000 Calculus of gallbladder with acute cholecystitis without obstruction: Secondary | ICD-10-CM | POA: Diagnosis not present

## 2016-05-06 DIAGNOSIS — K828 Other specified diseases of gallbladder: Secondary | ICD-10-CM | POA: Diagnosis not present

## 2016-05-06 DIAGNOSIS — E871 Hypo-osmolality and hyponatremia: Secondary | ICD-10-CM | POA: Diagnosis not present

## 2016-05-06 DIAGNOSIS — I10 Essential (primary) hypertension: Secondary | ICD-10-CM | POA: Diagnosis not present

## 2016-05-16 DIAGNOSIS — Z09 Encounter for follow-up examination after completed treatment for conditions other than malignant neoplasm: Secondary | ICD-10-CM | POA: Diagnosis not present

## 2016-05-16 DIAGNOSIS — R109 Unspecified abdominal pain: Secondary | ICD-10-CM | POA: Diagnosis not present

## 2016-05-16 DIAGNOSIS — Z9049 Acquired absence of other specified parts of digestive tract: Secondary | ICD-10-CM | POA: Diagnosis not present

## 2016-05-16 DIAGNOSIS — Z299 Encounter for prophylactic measures, unspecified: Secondary | ICD-10-CM | POA: Diagnosis not present

## 2016-05-21 DIAGNOSIS — Z79899 Other long term (current) drug therapy: Secondary | ICD-10-CM | POA: Diagnosis not present

## 2016-05-21 DIAGNOSIS — R110 Nausea: Secondary | ICD-10-CM | POA: Diagnosis not present

## 2016-05-24 ENCOUNTER — Encounter: Payer: Self-pay | Admitting: Internal Medicine

## 2016-05-24 DIAGNOSIS — Z79899 Other long term (current) drug therapy: Secondary | ICD-10-CM | POA: Diagnosis not present

## 2016-05-28 DIAGNOSIS — I10 Essential (primary) hypertension: Secondary | ICD-10-CM | POA: Diagnosis not present

## 2016-05-28 DIAGNOSIS — Z713 Dietary counseling and surveillance: Secondary | ICD-10-CM | POA: Diagnosis not present

## 2016-05-28 DIAGNOSIS — E1165 Type 2 diabetes mellitus with hyperglycemia: Secondary | ICD-10-CM | POA: Diagnosis not present

## 2016-05-28 DIAGNOSIS — E663 Overweight: Secondary | ICD-10-CM | POA: Diagnosis not present

## 2016-06-11 DIAGNOSIS — R110 Nausea: Secondary | ICD-10-CM | POA: Diagnosis not present

## 2016-06-11 DIAGNOSIS — E871 Hypo-osmolality and hyponatremia: Secondary | ICD-10-CM | POA: Diagnosis not present

## 2016-06-19 ENCOUNTER — Encounter: Payer: Self-pay | Admitting: Nurse Practitioner

## 2016-06-19 ENCOUNTER — Ambulatory Visit (INDEPENDENT_AMBULATORY_CARE_PROVIDER_SITE_OTHER): Payer: PPO | Admitting: Nurse Practitioner

## 2016-06-19 DIAGNOSIS — R112 Nausea with vomiting, unspecified: Secondary | ICD-10-CM | POA: Insufficient documentation

## 2016-06-19 NOTE — Patient Instructions (Signed)
1. Use your Phenergan antinausea medicine as needed. 2. Call us if any worsening symptoms and we can schedule an appointment.

## 2016-06-19 NOTE — Progress Notes (Signed)
Primary Care Physician:  Glenda Chroman, MD Primary Gastroenterologist:  Dr. Gala Romney  Chief Complaint  Patient presents with  . Nausea    had gallbladder 05/04/16, nausea afterwards but is better x 3 weeks    HPI:   Ariel Rodriguez is a 82 y.o. female who presents On referral from primary care for nausea. PCP notes reviewed. Last saw primary care on 05/21/2016 at which point it was noted the nausea has been sudden and persistent for 2 weeks, worsening, no associated symptoms. Recent cholecystectomy. She was restarted on promethazine and referred to GI.  Today she states she is feeling better today. She had a cholecystectomy and of August. Also had hyponatremia before and after surgery. Her sodium is now normal and nausea is improved. Phenergan helps as well, last taken 9/17. Denies abdominal pain, N/V, hematochezia, melena. Had heme+ stool on physical and is scheduled to have a colonoscopy with Dr. Britta Mccreedy. Denies fever, chills, unintentional weight loss. Denies chest pain, dyspnea, dizziness, lightheadedness, syncope, near syncope. Denies any other upper or lower GI symptoms.  Past Medical History:  Diagnosis Date  . Coronary artery disease    LHC (02/09/14):  Mid LAD occluded, patent circumflex, mid RCA sequential 90%, LIMA-LAD patent radial Y graft to distal RCA patent, EF 60%  . Diabetes mellitus without complication ([place])   . Heart attack 1999  . Hypertension     Past Surgical History:  Procedure Laterality Date  . CARDIAC SURGERY    . LAPAROSCOPIC CHOLECYSTECTOMY N/A 05/04/2016  . LEFT HEART CATHETERIZATION WITH CORONARY/GRAFT ANGIOGRAM N/A 02/09/2014   Procedure: LEFT HEART CATHETERIZATION WITH Beatrix Fetters;  Surgeon: Jettie Booze, MD;  Location: [facility] CATH LAB;  Service: Cardiovascular;  Laterality: N/A;    Current Outpatient Prescriptions  Medication Sig Dispense Refill  . ALPRAZolam (XANAX) 0.25 MG tablet Take 0.25 mg by mouth at bedtime as needed for anxiety  or sleep.     Marland Kitchen aspirin EC 81 MG tablet Take 81 mg by mouth daily.    Marland Kitchen atenolol (TENORMIN) 50 MG tablet Take 50 mg by mouth every evening.    Marland Kitchen atorvastatin (LIPITOR) 10 MG tablet Take 10 mg by mouth 2 (two) times daily.     . cholecalciferol (VITAMIN D) 1000 UNITS tablet Take 1,000 Units by mouth daily.    . Cinnamon 500 MG capsule Take 500 mg by mouth every evening.    . isosorbide mononitrate (IMDUR) 30 MG 24 hr tablet Take 1 tablet (30 mg total) by mouth daily. 90 tablet 1  . levothyroxine (SYNTHROID, LEVOTHROID) 75 MCG tablet Take 75 mcg by mouth daily before breakfast.    . lisinopril-hydrochlorothiazide (PRINZIDE,ZESTORETIC) 20-12.5 MG per tablet Take 0.5-1 tablets by mouth 2 (two) times daily. Pt takes one tablet in the morning and one-half tablet at night.    . Melatonin 3 MG TABS Take 3 mg by mouth as needed.    . metFORMIN (GLUCOPHAGE) 500 MG tablet Take 500 mg by mouth 2 (two) times daily with a meal. Pt takes two tablets in the morning and one at night.    . Multiple Vitamin (MULTIVITAMIN WITH MINERALS) TABS tablet Take 1 tablet by mouth daily.    . nitroGLYCERIN (NITROSTAT) 0.4 MG SL tablet Place 1 tablet (0.4 mg total) under the tongue every 5 (five) minutes as needed for chest pain. 25 tablet 3  . Omega-3 Fatty Acids (KP OMEGA-3 FISH OIL) 1200 MG CAPS Take 1,200 mg by mouth daily.    Marland Kitchen  ondansetron (ZOFRAN ODT) 4 MG disintegrating tablet Take 1 tablet (4 mg total) by mouth every 8 (eight) hours as needed for nausea. 10 tablet 0  . pantoprazole (PROTONIX) 40 MG tablet Take 40 mg by mouth daily.      No current facility-administered medications for this visit.     Allergies as of 06/19/2016 - Review Complete 06/19/2016  Allergen Reaction Noted  . Ciprofloxacin Diarrhea and Nausea And Vomiting 07/07/2013  . Erythromycin Diarrhea and Nausea And Vomiting 07/07/2013  . Prednisone Nausea And Vomiting 12/22/2014    No family history on file.  Social History   Social History  .  Marital status: Married    Spouse name: N/A  . Number of children: N/A  . Years of education: N/A   Occupational History  . Not on file.   Social History Main Topics  . Smoking status: Never Smoker  . Smokeless tobacco: Never Used  . Alcohol use No  . Drug use: No  . Sexual activity: Not on file   Other Topics Concern  . Not on file   Social History Narrative  . No narrative on file    Review of Systems: General: Negative for anorexia, weight loss, fever, chills, fatigue, weakness. ENT: Negative for hoarseness, difficulty swallowing. CV: Negative for chest pain, angina, palpitations, peripheral edema.  Respiratory: Negative for dyspnea at rest, cough, sputum, wheezing.  GI: See history of present illness. Endo: Negative for unusual weight change.  Heme: Negative for bruising or bleeding. Allergy: Negative for rash or hives.    Physical Exam: BP 136/80   Pulse 69   Temp 97.9 F (36.6 C) (Oral)   Ht 5' (1.524 m)   Wt 129 lb (58.5 kg)   BMI 25.19 kg/m  General:   Alert and oriented. Pleasant and cooperative. Well-nourished and well-developed.  Ears:  Normal auditory acuity. Cardiovascular:  S1, S2 present without murmurs appreciated. Extremities without clubbing or edema. Respiratory:  Clear to auscultation bilaterally. No wheezes, rales, or rhonchi. No distress.  Gastrointestinal:  +BS, soft, non-tender and non-distended. No HSM noted. No guarding or rebound. No masses appreciated. Laproscopic scars noted on the abdomen appear well-healed. Rectal:  Deferred  Musculoskalatal:  Symmetrical without gross deformities. Neurologic:  Alert and oriented x4;  grossly normal neurologically. Psych:  Alert and cooperative. Normal mood and affect. Heme/Lymph/Immune: No excessive bruising noted.    06/19/2016 10:18 AM   Disclaimer: This note was dictated with voice recognition software. Similar sounding words can inadvertently be transcribed and may not be corrected upon  review.

## 2016-06-19 NOTE — Assessment & Plan Note (Signed)
The patient has been having persistent nausea and vomiting in the setting of recent cholecystectomy and hyponatremia. Her gallbladder has been removed, her sodium is back to normal, and her symptoms have resolved. She still has some Phenergan antinausea medication which she has not had use in about a month. I recommended that she hang onto it indication use it again. Return for follow-up as needed for any recurrent symptoms.

## 2016-06-21 NOTE — Progress Notes (Signed)
CC'ED TO PCP 

## 2016-07-02 DIAGNOSIS — E119 Type 2 diabetes mellitus without complications: Secondary | ICD-10-CM | POA: Diagnosis not present

## 2016-07-02 DIAGNOSIS — I10 Essential (primary) hypertension: Secondary | ICD-10-CM | POA: Diagnosis not present

## 2016-07-02 DIAGNOSIS — I2510 Atherosclerotic heart disease of native coronary artery without angina pectoris: Secondary | ICD-10-CM | POA: Diagnosis not present

## 2016-07-03 ENCOUNTER — Ambulatory Visit (INDEPENDENT_AMBULATORY_CARE_PROVIDER_SITE_OTHER): Payer: PPO

## 2016-07-03 DIAGNOSIS — Z23 Encounter for immunization: Secondary | ICD-10-CM | POA: Diagnosis not present

## 2016-07-23 DIAGNOSIS — I2510 Atherosclerotic heart disease of native coronary artery without angina pectoris: Secondary | ICD-10-CM | POA: Diagnosis not present

## 2016-07-23 DIAGNOSIS — E119 Type 2 diabetes mellitus without complications: Secondary | ICD-10-CM | POA: Diagnosis not present

## 2016-07-23 DIAGNOSIS — I10 Essential (primary) hypertension: Secondary | ICD-10-CM | POA: Diagnosis not present

## 2016-07-25 DIAGNOSIS — D3132 Benign neoplasm of left choroid: Secondary | ICD-10-CM | POA: Diagnosis not present

## 2016-07-25 DIAGNOSIS — H35372 Puckering of macula, left eye: Secondary | ICD-10-CM | POA: Diagnosis not present

## 2016-07-25 DIAGNOSIS — H43813 Vitreous degeneration, bilateral: Secondary | ICD-10-CM | POA: Diagnosis not present

## 2016-07-25 DIAGNOSIS — H35421 Microcystoid degeneration of retina, right eye: Secondary | ICD-10-CM | POA: Diagnosis not present

## 2016-08-28 DIAGNOSIS — I10 Essential (primary) hypertension: Secondary | ICD-10-CM | POA: Diagnosis not present

## 2016-08-28 DIAGNOSIS — E119 Type 2 diabetes mellitus without complications: Secondary | ICD-10-CM | POA: Diagnosis not present

## 2016-08-28 DIAGNOSIS — I2510 Atherosclerotic heart disease of native coronary artery without angina pectoris: Secondary | ICD-10-CM | POA: Diagnosis not present

## 2016-09-05 DIAGNOSIS — E1165 Type 2 diabetes mellitus with hyperglycemia: Secondary | ICD-10-CM | POA: Diagnosis not present

## 2016-09-05 DIAGNOSIS — I10 Essential (primary) hypertension: Secondary | ICD-10-CM | POA: Diagnosis not present

## 2016-09-05 DIAGNOSIS — Z6826 Body mass index (BMI) 26.0-26.9, adult: Secondary | ICD-10-CM | POA: Diagnosis not present

## 2016-09-05 DIAGNOSIS — Z299 Encounter for prophylactic measures, unspecified: Secondary | ICD-10-CM | POA: Diagnosis not present

## 2016-09-05 DIAGNOSIS — E785 Hyperlipidemia, unspecified: Secondary | ICD-10-CM | POA: Diagnosis not present

## 2016-09-18 DIAGNOSIS — E119 Type 2 diabetes mellitus without complications: Secondary | ICD-10-CM | POA: Diagnosis not present

## 2016-09-18 DIAGNOSIS — I2510 Atherosclerotic heart disease of native coronary artery without angina pectoris: Secondary | ICD-10-CM | POA: Diagnosis not present

## 2016-09-18 DIAGNOSIS — I10 Essential (primary) hypertension: Secondary | ICD-10-CM | POA: Diagnosis not present

## 2016-10-01 ENCOUNTER — Ambulatory Visit (INDEPENDENT_AMBULATORY_CARE_PROVIDER_SITE_OTHER): Payer: PPO | Admitting: Nurse Practitioner

## 2016-10-01 ENCOUNTER — Telehealth: Payer: Self-pay

## 2016-10-01 ENCOUNTER — Other Ambulatory Visit: Payer: Self-pay

## 2016-10-01 ENCOUNTER — Encounter: Payer: Self-pay | Admitting: Nurse Practitioner

## 2016-10-01 VITALS — BP 127/75 | HR 66 | Temp 98.1°F | Ht 60.0 in | Wt 132.2 lb

## 2016-10-01 DIAGNOSIS — Z9889 Other specified postprocedural states: Secondary | ICD-10-CM

## 2016-10-01 DIAGNOSIS — R112 Nausea with vomiting, unspecified: Secondary | ICD-10-CM | POA: Diagnosis not present

## 2016-10-01 DIAGNOSIS — R195 Other fecal abnormalities: Secondary | ICD-10-CM

## 2016-10-01 NOTE — Assessment & Plan Note (Signed)
She notes postoperative nausea and vomiting. We will plan for Zofran before and possibly after procedure, as needed to prevent nausea last vomiting associated with conscious sedation.

## 2016-10-01 NOTE — Progress Notes (Signed)
Referring Provider: Glenda Chroman, MD Primary Care Physician:  Glenda Chroman, MD Primary GI:  Dr. Gala Romney  Chief Complaint  Patient presents with  . Blood In Stools    HPI:   Ariel Rodriguez is a 83 y.o. female who presents for consideration of colonoscopy with rectal bleeding. The patient was last seen in our office 06/19/2016 for nausea and vomiting. At the time of her last visit she noted persistence of 2 weeks of nausea and vomiting status post recent cholecystectomy. At the time of visit visit she was doing better, normal sodium, nausea improved, Phenergan helps. Had heme positive stool on physical and was scheduled to have a colonoscopy with Dr. Britta Mccreedy. No other GI symptoms. Recommended continue Phenergan and call with any worsening symptoms.  PCP notes reviewed. Last colonoscopy notes reviewed. Last colonoscopy completed 02/14/2010 by Dr. Melony Overly. Noted 3 mm polyp which was found to be benign on surgical pathology. No recommendations found in the system as far as repeat exam.  Today she states she's doing ok overall. Was planned for colonoscopy with Dr. Britta Mccreedy but received a letter stating he is leaving and she would like to setup the colonoscopy with [place]. She does not seen any hematochezia or melena. Had a heme+ stool card with PCP. Denies abdominal pain, N/V, fever, chills, unintentional weight loss. Denies chest pain, dyspnea, dizziness, lightheadedness, syncope, near syncope. Denies any other upper or lower GI symptoms.  Past Medical History:  Diagnosis Date  . Coronary artery disease    LHC (02/09/14):  Mid LAD occluded, patent circumflex, mid RCA sequential 90%, LIMA-LAD patent radial Y graft to distal RCA patent, EF 60%  . Diabetes mellitus without complication ([place])   . Heart attack 1999  . Hypertension     Past Surgical History:  Procedure Laterality Date  . CARDIAC SURGERY    . LAPAROSCOPIC CHOLECYSTECTOMY N/A 05/04/2016  . LEFT HEART CATHETERIZATION WITH CORONARY/GRAFT  ANGIOGRAM N/A 02/09/2014   Procedure: LEFT HEART CATHETERIZATION WITH Beatrix Fetters;  Surgeon: Jettie Booze, MD;  Location: [facility] CATH LAB;  Service: Cardiovascular;  Laterality: N/A;    Current Outpatient Prescriptions  Medication Sig Dispense Refill  . ALPRAZolam (XANAX) 0.25 MG tablet Take 0.25 mg by mouth at bedtime as needed for anxiety or sleep.     Marland Kitchen aspirin EC 81 MG tablet Take 81 mg by mouth daily.    Marland Kitchen atenolol (TENORMIN) 50 MG tablet Take 50 mg by mouth every evening.    Marland Kitchen atorvastatin (LIPITOR) 10 MG tablet Take 10 mg by mouth 2 (two) times daily.     . cholecalciferol (VITAMIN D) 1000 UNITS tablet Take 1,000 Units by mouth daily.    . Cinnamon 500 MG capsule Take 500 mg by mouth every evening.    . isosorbide mononitrate (IMDUR) 30 MG 24 hr tablet Take 1 tablet (30 mg total) by mouth daily. 90 tablet 1  . levothyroxine (SYNTHROID, LEVOTHROID) 75 MCG tablet Take 75 mcg by mouth daily before breakfast.    . lisinopril-hydrochlorothiazide (PRINZIDE,ZESTORETIC) 20-12.5 MG per tablet Take 0.5-1 tablets by mouth 2 (two) times daily. Pt takes one tablet in the morning and one-half tablet at night.    . Melatonin 3 MG TABS Take 3 mg by mouth as needed.    . metFORMIN (GLUCOPHAGE) 500 MG tablet Take 500 mg by mouth daily with breakfast. Pt takes two tablets in the morning and one at night.    . Multiple Vitamin (MULTIVITAMIN WITH MINERALS) TABS tablet  Take 1 tablet by mouth daily.    . nitroGLYCERIN (NITROSTAT) 0.4 MG SL tablet Place 1 tablet (0.4 mg total) under the tongue every 5 (five) minutes as needed for chest pain. 25 tablet 3  . Omega-3 Fatty Acids (KP OMEGA-3 FISH OIL) 1200 MG CAPS Take 1,200 mg by mouth daily.    . pantoprazole (PROTONIX) 40 MG tablet Take 40 mg by mouth daily.      No current facility-administered medications for this visit.     Allergies as of 10/01/2016 - Review Complete 10/01/2016  Allergen Reaction Noted  . Ciprofloxacin Diarrhea and Nausea  And Vomiting 07/07/2013  . Erythromycin Diarrhea and Nausea And Vomiting 07/07/2013  . Prednisone Nausea And Vomiting 12/22/2014    No family history on file.  Social History   Social History  . Marital status: Married    Spouse name: N/A  . Number of children: N/A  . Years of education: N/A   Social History Main Topics  . Smoking status: Never Smoker  . Smokeless tobacco: Never Used  . Alcohol use No  . Drug use: No  . Sexual activity: Not Asked   Other Topics Concern  . None   Social History Narrative  . None    Review of Systems: General: Negative for anorexia, weight loss, fever, chills, fatigue, weakness. ENT: Negative for hoarseness, difficulty swallowing. CV: Negative for chest pain, angina, palpitations, peripheral edema.  Respiratory: Negative for dyspnea at rest, cough, sputum, wheezing.  GI: See history of present illness. Endo: Negative for unusual weight change.  Heme: Negative for bruising or bleeding. Allergy: Negative for rash or hives.   Physical Exam: BP 127/75   Pulse 66   Temp 98.1 F (36.7 C) (Oral)   Ht 5' (1.524 m)   Wt 132 lb 3.2 oz (60 kg)   BMI 25.82 kg/m  General:   Alert and oriented. Pleasant and cooperative. Well-nourished and well-developed.  Eyes:  Without icterus, sclera clear and conjunctiva pink.  Ears:  Normal auditory acuity. Cardiovascular:  S1, S2 present with 2/6 systolic murmur appreciated. Extremities without clubbing or edema. Respiratory:  Clear to auscultation bilaterally. No wheezes, rales, or rhonchi. No distress.  Gastrointestinal:  +BS, soft, non-tender and non-distended. No HSM noted. No guarding or rebound. No masses appreciated.  Rectal:  Deferred  Musculoskalatal:  Symmetrical without gross deformities. Neurologic:  Alert and oriented x4;  grossly normal neurologically. Psych:  Alert and cooperative. Normal mood and affect. Heme/Lymph/Immune: No excessive bruising noted.    10/01/2016 10:32  AM   Disclaimer: This note was dictated with voice recognition software. Similar sounding words can inadvertently be transcribed and may not be corrected upon review.

## 2016-10-01 NOTE — Assessment & Plan Note (Signed)
The patient was noted to have heme positive stool on her physical exam in May 2017. She's had quite a time getting scheduled for colonoscopy. She underwent laparoscopic cholecystectomy and needed to wait to recover from that, she went to Dr. Britta Mccreedy to be rescheduled but then received a letter saying that he was leaving and she would need to go elsewhere. She called Dr. Rosina Lowenstein office to completed her last colonoscopy in 2011 associated with our practice but was told since she was seen here she would have to come back here. At this point we will proceed with colonoscopy.  **NOTE: She does have postoperative nausea and vomiting and will likely benefit from preprocedure and postprocedure Zofran as needed.  Proceed with TCS with Dr. Gala Romney in near future: the risks, benefits, and alternatives have been discussed with the patient in detail. The patient states understanding and desires to proceed.  Technically Xanax is on her medication list "as necessary" but she states she never takes it. If she needs sleep medicine she'll take over-the-counter melatonin. No other anticoagulants, anxiolytics, chronic pain medications, or antidepressants. Conscious sedation should be adequate for her last procedure as it was for the previous.  She does have a history of coronary artery disease and CABG in 1999. Was last seen by cardiology about one year ago and last cardiac cath in 2015. Noted EF of 60%, doing quite well at her last cardiology visit.

## 2016-10-01 NOTE — Assessment & Plan Note (Signed)
Symptoms resolved, continue to monitor. 

## 2016-10-01 NOTE — Patient Instructions (Signed)
1. We will schedule your procedure for you. 2. Return for follow-up based on postprocedure recommendations. 3. Call us if you have any worsening or recurrent symptoms.

## 2016-10-01 NOTE — Progress Notes (Signed)
CC'ED TO PCP 

## 2016-10-01 NOTE — Telephone Encounter (Signed)
PA info for Colonoscopy faxed to [facility].

## 2016-10-09 ENCOUNTER — Encounter: Payer: Self-pay | Admitting: Internal Medicine

## 2016-10-09 ENCOUNTER — Telehealth: Payer: Self-pay

## 2016-10-09 NOTE — Telephone Encounter (Signed)
Called pt. Changed time for TCS tomorrow. Procedure time will be 10:15 am, pt to arrive at 9:15am. Advised pt to start drinking prep in the morning at 5:15am and complete by 7:15am. Endo scheduler informed.

## 2016-10-10 ENCOUNTER — Ambulatory Visit (HOSPITAL_COMMUNITY)
Admission: RE | Admit: 2016-10-10 | Discharge: 2016-10-10 | Disposition: A | Discharge status: Home / Self Care | Payer: PPO | Source: Ambulatory Visit | Attending: Internal Medicine | Admitting: Internal Medicine

## 2016-10-10 ENCOUNTER — Encounter (HOSPITAL_COMMUNITY): Payer: Self-pay | Admitting: *Deleted

## 2016-10-10 ENCOUNTER — Encounter (HOSPITAL_COMMUNITY)
Admission: RE | Disposition: A | Discharge status: Home / Self Care | Payer: Self-pay | Source: Ambulatory Visit | Attending: Internal Medicine

## 2016-10-10 DIAGNOSIS — Z7982 Long term (current) use of aspirin: Secondary | ICD-10-CM | POA: Insufficient documentation

## 2016-10-10 DIAGNOSIS — Z7984 Long term (current) use of oral hypoglycemic drugs: Secondary | ICD-10-CM | POA: Insufficient documentation

## 2016-10-10 DIAGNOSIS — R195 Other fecal abnormalities: Secondary | ICD-10-CM | POA: Diagnosis not present

## 2016-10-10 DIAGNOSIS — K921 Melena: Secondary | ICD-10-CM | POA: Diagnosis not present

## 2016-10-10 DIAGNOSIS — I10 Essential (primary) hypertension: Secondary | ICD-10-CM | POA: Insufficient documentation

## 2016-10-10 DIAGNOSIS — I2510 Atherosclerotic heart disease of native coronary artery without angina pectoris: Secondary | ICD-10-CM | POA: Diagnosis not present

## 2016-10-10 DIAGNOSIS — Z79899 Other long term (current) drug therapy: Secondary | ICD-10-CM | POA: Insufficient documentation

## 2016-10-10 DIAGNOSIS — E119 Type 2 diabetes mellitus without complications: Secondary | ICD-10-CM | POA: Insufficient documentation

## 2016-10-10 DIAGNOSIS — I252 Old myocardial infarction: Secondary | ICD-10-CM | POA: Diagnosis not present

## 2016-10-10 DIAGNOSIS — D128 Benign neoplasm of rectum: Secondary | ICD-10-CM

## 2016-10-10 HISTORY — PX: COLONOSCOPY: SHX5424

## 2016-10-10 HISTORY — PX: POLYPECTOMY: SHX5525

## 2016-10-10 LAB — GLUCOSE, CAPILLARY: GLUCOSE-CAPILLARY: 124 mg/dL — AB (ref 65–99)

## 2016-10-10 SURGERY — COLONOSCOPY
Anesthesia: Moderate Sedation

## 2016-10-10 MED ORDER — MEPERIDINE HCL 100 MG/ML IJ SOLN
INTRAMUSCULAR | Status: DC | PRN
  Administered 2016-10-10: 50 mg via INTRAVENOUS

## 2016-10-10 MED ORDER — SODIUM CHLORIDE 0.9 % IV SOLN
INTRAVENOUS | Status: DC
  Administered 2016-10-10: 09:00:00 via INTRAVENOUS

## 2016-10-10 MED ORDER — MEPERIDINE HCL 100 MG/ML IJ SOLN
INTRAMUSCULAR | Status: AC
  Filled 2016-10-10: qty 2

## 2016-10-10 MED ORDER — MIDAZOLAM HCL 5 MG/5ML IJ SOLN
INTRAMUSCULAR | Status: DC | PRN
  Administered 2016-10-10: 1 mg via INTRAVENOUS
  Administered 2016-10-10: 2 mg via INTRAVENOUS

## 2016-10-10 MED ORDER — ONDANSETRON HCL 4 MG/2ML IJ SOLN
4.0000 mg | Freq: Once | INTRAMUSCULAR | Status: AC
Start: 2016-10-10 — End: 2016-10-10
  Administered 2016-10-10: 4 mg via INTRAVENOUS

## 2016-10-10 MED ORDER — ONDANSETRON HCL 4 MG/2ML IJ SOLN
INTRAMUSCULAR | Status: AC
  Filled 2016-10-10: qty 2

## 2016-10-10 MED ORDER — MIDAZOLAM HCL 5 MG/5ML IJ SOLN
INTRAMUSCULAR | Status: AC
  Filled 2016-10-10: qty 10

## 2016-10-10 MED ORDER — ONDANSETRON HCL 4 MG/2ML IJ SOLN
INTRAMUSCULAR | Status: DC | PRN
  Administered 2016-10-10: 4 mg via INTRAVENOUS

## 2016-10-10 MED ORDER — STERILE WATER FOR IRRIGATION IR SOLN
Status: DC | PRN
  Administered 2016-10-10: 10:00:00

## 2016-10-10 NOTE — H&P (View-Only) (Signed)
Referring Provider: Glenda Chroman, MD Primary Care Physician:  Glenda Chroman, MD Primary GI:  Dr. Gala Romney  Chief Complaint  Patient presents with  . Blood In Stools    HPI:   Ariel Rodriguez is a 83 y.o. female who presents for consideration of colonoscopy with rectal bleeding. The patient was last seen in our office 06/19/2016 for nausea and vomiting. At the time of her last visit she noted persistence of 2 weeks of nausea and vomiting status post recent cholecystectomy. At the time of visit visit she was doing better, normal sodium, nausea improved, Phenergan helps. Had heme positive stool on physical and was scheduled to have a colonoscopy with Dr. Britta Mccreedy. No other GI symptoms. Recommended continue Phenergan and call with any worsening symptoms.  PCP notes reviewed. Last colonoscopy notes reviewed. Last colonoscopy completed 02/14/2010 by Dr. Melony Overly. Noted 3 mm polyp which was found to be benign on surgical pathology. No recommendations found in the system as far as repeat exam.  Today she states she's doing ok overall. Was planned for colonoscopy with Dr. Britta Mccreedy but received a letter stating he is leaving and she would like to setup the colonoscopy with [place]. She does not seen any hematochezia or melena. Had a heme+ stool card with PCP. Denies abdominal pain, N/V, fever, chills, unintentional weight loss. Denies chest pain, dyspnea, dizziness, lightheadedness, syncope, near syncope. Denies any other upper or lower GI symptoms.  Past Medical History:  Diagnosis Date  . Coronary artery disease    LHC (02/09/14):  Mid LAD occluded, patent circumflex, mid RCA sequential 90%, LIMA-LAD patent radial Y graft to distal RCA patent, EF 60%  . Diabetes mellitus without complication ([place])   . Heart attack 1999  . Hypertension     Past Surgical History:  Procedure Laterality Date  . CARDIAC SURGERY    . LAPAROSCOPIC CHOLECYSTECTOMY N/A 05/04/2016  . LEFT HEART CATHETERIZATION WITH CORONARY/GRAFT  ANGIOGRAM N/A 02/09/2014   Procedure: LEFT HEART CATHETERIZATION WITH Beatrix Fetters;  Surgeon: Jettie Booze, MD;  Location: [facility] CATH LAB;  Service: Cardiovascular;  Laterality: N/A;    Current Outpatient Prescriptions  Medication Sig Dispense Refill  . ALPRAZolam (XANAX) 0.25 MG tablet Take 0.25 mg by mouth at bedtime as needed for anxiety or sleep.     Marland Kitchen aspirin EC 81 MG tablet Take 81 mg by mouth daily.    Marland Kitchen atenolol (TENORMIN) 50 MG tablet Take 50 mg by mouth every evening.    Marland Kitchen atorvastatin (LIPITOR) 10 MG tablet Take 10 mg by mouth 2 (two) times daily.     . cholecalciferol (VITAMIN D) 1000 UNITS tablet Take 1,000 Units by mouth daily.    . Cinnamon 500 MG capsule Take 500 mg by mouth every evening.    . isosorbide mononitrate (IMDUR) 30 MG 24 hr tablet Take 1 tablet (30 mg total) by mouth daily. 90 tablet 1  . levothyroxine (SYNTHROID, LEVOTHROID) 75 MCG tablet Take 75 mcg by mouth daily before breakfast.    . lisinopril-hydrochlorothiazide (PRINZIDE,ZESTORETIC) 20-12.5 MG per tablet Take 0.5-1 tablets by mouth 2 (two) times daily. Pt takes one tablet in the morning and one-half tablet at night.    . Melatonin 3 MG TABS Take 3 mg by mouth as needed.    . metFORMIN (GLUCOPHAGE) 500 MG tablet Take 500 mg by mouth daily with breakfast. Pt takes two tablets in the morning and one at night.    . Multiple Vitamin (MULTIVITAMIN WITH MINERALS) TABS tablet  Take 1 tablet by mouth daily.    . nitroGLYCERIN (NITROSTAT) 0.4 MG SL tablet Place 1 tablet (0.4 mg total) under the tongue every 5 (five) minutes as needed for chest pain. 25 tablet 3  . Omega-3 Fatty Acids (KP OMEGA-3 FISH OIL) 1200 MG CAPS Take 1,200 mg by mouth daily.    . pantoprazole (PROTONIX) 40 MG tablet Take 40 mg by mouth daily.      No current facility-administered medications for this visit.     Allergies as of 10/01/2016 - Review Complete 10/01/2016  Allergen Reaction Noted  . Ciprofloxacin Diarrhea and Nausea  And Vomiting 07/07/2013  . Erythromycin Diarrhea and Nausea And Vomiting 07/07/2013  . Prednisone Nausea And Vomiting 12/22/2014    No family history on file.  Social History   Social History  . Marital status: Married    Spouse name: N/A  . Number of children: N/A  . Years of education: N/A   Social History Main Topics  . Smoking status: Never Smoker  . Smokeless tobacco: Never Used  . Alcohol use No  . Drug use: No  . Sexual activity: Not Asked   Other Topics Concern  . None   Social History Narrative  . None    Review of Systems: General: Negative for anorexia, weight loss, fever, chills, fatigue, weakness. ENT: Negative for hoarseness, difficulty swallowing. CV: Negative for chest pain, angina, palpitations, peripheral edema.  Respiratory: Negative for dyspnea at rest, cough, sputum, wheezing.  GI: See history of present illness. Endo: Negative for unusual weight change.  Heme: Negative for bruising or bleeding. Allergy: Negative for rash or hives.   Physical Exam: BP 127/75   Pulse 66   Temp 98.1 F (36.7 C) (Oral)   Ht 5' (1.524 m)   Wt 132 lb 3.2 oz (60 kg)   BMI 25.82 kg/m  General:   Alert and oriented. Pleasant and cooperative. Well-nourished and well-developed.  Eyes:  Without icterus, sclera clear and conjunctiva pink.  Ears:  Normal auditory acuity. Cardiovascular:  S1, S2 present with 2/6 systolic murmur appreciated. Extremities without clubbing or edema. Respiratory:  Clear to auscultation bilaterally. No wheezes, rales, or rhonchi. No distress.  Gastrointestinal:  +BS, soft, non-tender and non-distended. No HSM noted. No guarding or rebound. No masses appreciated.  Rectal:  Deferred  Musculoskalatal:  Symmetrical without gross deformities. Neurologic:  Alert and oriented x4;  grossly normal neurologically. Psych:  Alert and cooperative. Normal mood and affect. Heme/Lymph/Immune: No excessive bruising noted.    10/01/2016 10:32  AM   Disclaimer: This note was dictated with voice recognition software. Similar sounding words can inadvertently be transcribed and may not be corrected upon review.

## 2016-10-10 NOTE — Interval H&P Note (Signed)
History and Physical Interval Note:  10/10/2016 9:30 AM  Ariel Rodriguez  has presented today for surgery, with the diagnosis of Heme positive stool  The various methods of treatment have been discussed with the patient and family. After consideration of risks, benefits and other options for treatment, the patient has consented to  Procedure(s) with comments: COLONOSCOPY (N/A) - 1:45 PM as a surgical intervention .  The patient's history has been reviewed, patient examined, no change in status, stable for surgery.  I have reviewed the patient's chart and labs.  Questions were answered to the patient's satisfaction.     Ariel Rodriguez  No change. Diagnostic colonoscopy per plan.  The risks, benefits, limitations, alternatives and imponderables have been reviewed with the patient. Questions have been answered. All parties are agreeable.

## 2016-10-10 NOTE — Discharge Instructions (Addendum)
Colonoscopy Discharge Instructions  Read the instructions outlined below and refer to this sheet in the next few weeks. These discharge instructions provide you with general information on caring for yourself after you leave the hospital. Your doctor may also give you specific instructions. While your treatment has been planned according to the most current medical practices available, unavoidable complications occasionally occur. If you have any problems or questions after discharge, call Dr. Gala Romney at [phone]. ACTIVITY  You may resume your regular activity, but move at a slower pace for the next 24 hours.   Take frequent rest periods for the next 24 hours.   Walking will help get rid of the air and reduce the bloated feeling in your belly (abdomen).   No driving for 24 hours (because of the medicine (anesthesia) used during the test).    Do not sign any important legal documents or operate any machinery for 24 hours (because of the anesthesia used during the test).  NUTRITION  Drink plenty of fluids.   You may resume your normal diet as instructed by your doctor.   Begin with a light meal and progress to your normal diet. Heavy or fried foods are harder to digest and may make you feel sick to your stomach (nauseated).   Avoid alcoholic beverages for 24 hours or as instructed.  MEDICATIONS  You may resume your normal medications unless your doctor tells you otherwise.  WHAT YOU CAN EXPECT TODAY  Some feelings of bloating in the abdomen.   Passage of more gas than usual.   Spotting of blood in your stool or on the toilet paper.  IF YOU HAD POLYPS REMOVED DURING THE COLONOSCOPY:  No aspirin products for 7 days or as instructed.   No alcohol for 7 days or as instructed.   Eat a soft diet for the next 24 hours.  FINDING OUT THE RESULTS OF YOUR TEST Not all test results are available during your visit. If your test results are not back during the visit, make an appointment  with your caregiver to find out the results. Do not assume everything is normal if you have not heard from your caregiver or the medical facility. It is important for you to follow up on all of your test results.  SEEK IMMEDIATE MEDICAL ATTENTION IF:  You have more than a spotting of blood in your stool.   Your belly is swollen (abdominal distention).   You are nauseated or vomiting.   You have a temperature over 101.   You have abdominal pain or discomfort that is severe or gets worse throughout the day.     Colon polyp information provided  Further recommendations to follow pending review of pathology report      Colon Polyps Introduction Polyps are tissue growths inside the body. Polyps can grow in many places, including the large intestine (colon). A polyp may be a round bump or a mushroom-shaped growth. You could have one polyp or several. Most colon polyps are noncancerous (benign). However, some colon polyps can become cancerous over time. What are the causes? The exact cause of colon polyps is not known. What increases the risk? This condition is more likely to develop in people who:  Have a family history of colon cancer or colon polyps.  Are older than 28 or older than 45 if they are African American.  Have inflammatory bowel disease, such as ulcerative colitis or Crohn disease.  Are overweight.  Smoke cigarettes.  Do not get enough exercise.  Drink too much alcohol.  Eat a diet that is:  High in fat and red meat.  Low in fiber.  Had childhood cancer that was treated with abdominal radiation. What are the signs or symptoms? Most polyps do not cause symptoms. If you have symptoms, they may include:  Blood coming from your rectum when having a bowel movement.  Blood in your stool.The stool may look dark red or black.  A change in bowel habits, such as constipation or diarrhea. How is this diagnosed? This condition is diagnosed with a colonoscopy.  This is a procedure that uses a lighted, flexible scope to look at the inside of your colon. How is this treated? Treatment for this condition involves removing any polyps that are found. Those polyps will then be tested for cancer. If cancer is found, your health care provider will talk to you about options for colon cancer treatment. Follow these instructions at home: Diet  Eat plenty of fiber, such as fruits, vegetables, and whole grains.  Eat foods that are high in calcium and vitamin D, such as milk, cheese, yogurt, eggs, liver, fish, and broccoli.  Limit foods high in fat, red meats, and processed meats, such as hot dogs, sausage, bacon, and lunch meats.  Maintain a healthy weight, or lose weight if recommended by your health care provider. General instructions  Do not smoke cigarettes.  Do not drink alcohol excessively.  Keep all follow-up visits as told by your health care provider. This is important. This includes keeping regularly scheduled colonoscopies. Talk to your health care provider about when you need a colonoscopy.  Exercise every day or as told by your health care provider. Contact a health care provider if:  You have new or worsening bleeding during a bowel movement.  You have new or increased blood in your stool.  You have a change in bowel habits.  You unexpectedly lose weight. This information is not intended to replace advice given to you by your health care provider. Make sure you discuss any questions you have with your health care provider. Document Released: 05/23/2004 Document Revised: 02/02/2016 Document Reviewed: 07/18/2015  2017 [employer]

## 2016-10-10 NOTE — Op Note (Signed)
[facility] Patient Name: Ariel Rodriguez Procedure Date: 10/10/2016 9:13 AM MRN: [id number] Date of Birth: February 12, 1931 Attending MD: Norvel Richards , MD CSN: [id number] Age: 83 Admit Type: Outpatient Procedure:                Colonoscopy with snare polypectomy Indications:              Heme positive stool Providers:                Norvel Richards, MD, Otis Peak B. Gwenlyn Perking RN, RN,                            Aram Candela Referring MD:              Medicines:                Midazolam 3 mg IV, Meperidine 50 mg IV Complications:            No immediate complications. Estimated Blood Loss:     Estimated blood loss was minimal. Procedure:                Pre-Anesthesia Assessment:                           - Prior to the procedure, a History and Physical                            was performed, and patient medications and                            allergies were reviewed. The patient's tolerance of                            previous anesthesia was also reviewed. The risks                            and benefits of the procedure and the sedation                            options and risks were discussed with the patient.                            All questions were answered, and informed consent                            was obtained. Prior Anticoagulants: The patient has                            taken no previous anticoagulant or antiplatelet                            agents. ASA Grade Assessment: II - A patient with                            mild systemic disease. After reviewing the risks  and benefits, the patient was deemed in                            satisfactory condition to undergo the procedure.                           After obtaining informed consent, the colonoscope                            was passed under direct vision. Throughout the                            procedure, the patient's blood pressure, pulse, and              oxygen saturations were monitored continuously. The                            EC-[id number] scope was introduced through                            the anus and advanced to the the cecum, identified                            by appendiceal orifice and ileocecal valve. The                            colonoscopy was performed without difficulty. The                            patient tolerated the procedure well. The quality                            of the bowel preparation was adequate. The entire                            colon was well visualized. The ileocecal valve,                            appendiceal orifice, and rectum were photographed. Scope In: 9:38:18 AM Scope Out: 9:58:54 AM Scope Withdrawal Time: 0 hours 13 minutes 54 seconds  Total Procedure Duration: 0 hours 20 minutes 36 seconds  Findings:      The perianal and digital rectal examinations were normal.      A 5 mm polyp was found in the rectum. The polyp was semi-pedunculated.       The entire examined colon otherwise appeared normal on direct and       retroflexion views. The polyp was removed with a cold snare. Resection       and retrieval were complete. Estimated blood loss was minimal Impression:               - One 5 mm polyp in the rectum, removed with a cold                            snare. Resected and retrieved.                           -  The entire examined colon is normal on direct and                            retroflexion views. Moderate Sedation:      Moderate (conscious) sedation was administered by the endoscopy nurse       and supervised by the endoscopist. The following parameters were       monitored: oxygen saturation, heart rate, blood pressure, respiratory       rate, EKG, adequacy of pulmonary ventilation, and response to care.       Total physician intraservice time was 23 minutes. Recommendation:           - Patient has a contact number available for                             emergencies. The signs and symptoms of potential                            delayed complications were discussed with the                            patient. Return to normal activities tomorrow.                            Written discharge instructions were provided to the                            patient.                           - Resume previous diet.                           - Continue present medications. Follow-up on                            pathology                           - Return to GI office (date not yet determined).                           - Repeat colonoscopy date to be determined after                            pending pathology results are reviewed for                            surveillance. Procedure Code(s):        --- Professional ---                           [phone], Colonoscopy, flexible; with removal of                            tumor(s), polyp(s), or other lesion(s) by snare  technique                           [phone], Moderate sedation services provided by the                            same physician or other qualified health care                            professional performing the diagnostic or                            therapeutic service that the sedation supports,                            requiring the presence of an independent trained                            observer to assist in the monitoring of the                            patient's level of consciousness and physiological                            status; initial 15 minutes of intraservice time,                            patient age 78 years or older                           [phone], Moderate sedation services; each additional                            15 minutes intraservice time Diagnosis Code(s):        --- Professional ---                           K62.1, Rectal polyp                           R19.5, Other fecal abnormalities CPT copyright  2016 American Medical Association. All rights reserved. The codes documented in this report are preliminary and upon coder review may  be revised to meet current compliance requirements. Cristopher Estimable. Fermin Yan, MD Norvel Richards, MD 10/10/2016 10:11:38 AM This report has been signed electronically. Number of Addenda: 0

## 2016-10-11 ENCOUNTER — Encounter: Payer: Self-pay | Admitting: Internal Medicine

## 2016-10-17 ENCOUNTER — Encounter (HOSPITAL_COMMUNITY): Payer: Self-pay | Admitting: Internal Medicine

## 2016-10-22 DIAGNOSIS — L820 Inflamed seborrheic keratosis: Secondary | ICD-10-CM | POA: Diagnosis not present

## 2016-10-22 DIAGNOSIS — L570 Actinic keratosis: Secondary | ICD-10-CM | POA: Diagnosis not present

## 2016-10-22 DIAGNOSIS — L718 Other rosacea: Secondary | ICD-10-CM | POA: Diagnosis not present

## 2016-12-07 DIAGNOSIS — I10 Essential (primary) hypertension: Secondary | ICD-10-CM | POA: Diagnosis not present

## 2016-12-07 DIAGNOSIS — I2510 Atherosclerotic heart disease of native coronary artery without angina pectoris: Secondary | ICD-10-CM | POA: Diagnosis not present

## 2016-12-07 DIAGNOSIS — E119 Type 2 diabetes mellitus without complications: Secondary | ICD-10-CM | POA: Diagnosis not present

## 2016-12-11 DIAGNOSIS — Z713 Dietary counseling and surveillance: Secondary | ICD-10-CM | POA: Diagnosis not present

## 2016-12-11 DIAGNOSIS — E7800 Pure hypercholesterolemia, unspecified: Secondary | ICD-10-CM | POA: Diagnosis not present

## 2016-12-11 DIAGNOSIS — I10 Essential (primary) hypertension: Secondary | ICD-10-CM | POA: Diagnosis not present

## 2016-12-11 DIAGNOSIS — F419 Anxiety disorder, unspecified: Secondary | ICD-10-CM | POA: Diagnosis not present

## 2016-12-11 DIAGNOSIS — I6529 Occlusion and stenosis of unspecified carotid artery: Secondary | ICD-10-CM | POA: Diagnosis not present

## 2016-12-11 DIAGNOSIS — K219 Gastro-esophageal reflux disease without esophagitis: Secondary | ICD-10-CM | POA: Diagnosis not present

## 2016-12-11 DIAGNOSIS — M810 Age-related osteoporosis without current pathological fracture: Secondary | ICD-10-CM | POA: Diagnosis not present

## 2016-12-11 DIAGNOSIS — E1165 Type 2 diabetes mellitus with hyperglycemia: Secondary | ICD-10-CM | POA: Diagnosis not present

## 2016-12-11 DIAGNOSIS — E039 Hypothyroidism, unspecified: Secondary | ICD-10-CM | POA: Diagnosis not present

## 2016-12-11 DIAGNOSIS — Z299 Encounter for prophylactic measures, unspecified: Secondary | ICD-10-CM | POA: Diagnosis not present

## 2016-12-11 DIAGNOSIS — I2510 Atherosclerotic heart disease of native coronary artery without angina pectoris: Secondary | ICD-10-CM | POA: Diagnosis not present

## 2017-01-02 DIAGNOSIS — E119 Type 2 diabetes mellitus without complications: Secondary | ICD-10-CM | POA: Diagnosis not present

## 2017-01-02 DIAGNOSIS — I10 Essential (primary) hypertension: Secondary | ICD-10-CM | POA: Diagnosis not present

## 2017-01-02 DIAGNOSIS — I2510 Atherosclerotic heart disease of native coronary artery without angina pectoris: Secondary | ICD-10-CM | POA: Diagnosis not present

## 2017-01-08 DIAGNOSIS — Z961 Presence of intraocular lens: Secondary | ICD-10-CM | POA: Diagnosis not present

## 2017-01-08 DIAGNOSIS — H5010 Unspecified exotropia: Secondary | ICD-10-CM | POA: Diagnosis not present

## 2017-01-08 DIAGNOSIS — E119 Type 2 diabetes mellitus without complications: Secondary | ICD-10-CM | POA: Diagnosis not present

## 2017-01-21 DIAGNOSIS — Z1231 Encounter for screening mammogram for malignant neoplasm of breast: Secondary | ICD-10-CM | POA: Diagnosis not present

## 2017-01-30 DIAGNOSIS — E119 Type 2 diabetes mellitus without complications: Secondary | ICD-10-CM | POA: Diagnosis not present

## 2017-01-30 DIAGNOSIS — I2510 Atherosclerotic heart disease of native coronary artery without angina pectoris: Secondary | ICD-10-CM | POA: Diagnosis not present

## 2017-01-30 DIAGNOSIS — I10 Essential (primary) hypertension: Secondary | ICD-10-CM | POA: Diagnosis not present

## 2017-02-01 DIAGNOSIS — Z79899 Other long term (current) drug therapy: Secondary | ICD-10-CM | POA: Diagnosis not present

## 2017-02-01 DIAGNOSIS — Z1389 Encounter for screening for other disorder: Secondary | ICD-10-CM | POA: Diagnosis not present

## 2017-02-01 DIAGNOSIS — I2510 Atherosclerotic heart disease of native coronary artery without angina pectoris: Secondary | ICD-10-CM | POA: Diagnosis not present

## 2017-02-01 DIAGNOSIS — Z7189 Other specified counseling: Secondary | ICD-10-CM | POA: Diagnosis not present

## 2017-02-01 DIAGNOSIS — R5383 Other fatigue: Secondary | ICD-10-CM | POA: Diagnosis not present

## 2017-02-01 DIAGNOSIS — I6529 Occlusion and stenosis of unspecified carotid artery: Secondary | ICD-10-CM | POA: Diagnosis not present

## 2017-02-01 DIAGNOSIS — Z0000 Encounter for general adult medical examination without abnormal findings: Secondary | ICD-10-CM | POA: Diagnosis not present

## 2017-02-01 DIAGNOSIS — Z6825 Body mass index (BMI) 25.0-25.9, adult: Secondary | ICD-10-CM | POA: Diagnosis not present

## 2017-02-01 DIAGNOSIS — E039 Hypothyroidism, unspecified: Secondary | ICD-10-CM | POA: Diagnosis not present

## 2017-02-01 DIAGNOSIS — I10 Essential (primary) hypertension: Secondary | ICD-10-CM | POA: Diagnosis not present

## 2017-02-01 DIAGNOSIS — E7800 Pure hypercholesterolemia, unspecified: Secondary | ICD-10-CM | POA: Diagnosis not present

## 2017-02-01 DIAGNOSIS — E1165 Type 2 diabetes mellitus with hyperglycemia: Secondary | ICD-10-CM | POA: Diagnosis not present

## 2017-02-01 DIAGNOSIS — Z299 Encounter for prophylactic measures, unspecified: Secondary | ICD-10-CM | POA: Diagnosis not present

## 2017-02-12 DIAGNOSIS — E2839 Other primary ovarian failure: Secondary | ICD-10-CM | POA: Diagnosis not present

## 2017-02-17 NOTE — Progress Notes (Signed)
HPI The patient presents for followup of known coronary disease with CABG in 1999. She was in the hospital in June 2015. She had chest pain ruled out for myocardial infarction. She had patent bypass grafts on cath.  Since I last saw her she has done well. She rarely gets some back pain only when she pushes a vacuum. This is reminiscent of her previous angina but much improved since she started Imdur. She paces herself and she does well. She has no resting symptoms. She denies chest pressure, neck or arm discomfort. She has no shortness of breath, PND or orthopnea. She's had no weight gain or edema.   Allergies  Allergen Reactions  . Ciprofloxacin Diarrhea and Nausea And Vomiting  . Codeine Other (See Comments)    Pt went to hospital for flu and states that kept getting worse with med.   . Erythromycin Diarrhea and Nausea And Vomiting  . Prednisone Nausea And Vomiting  . Sulfa Antibiotics Nausea And Vomiting    Current Outpatient Prescriptions  Medication Sig Dispense Refill  . aspirin EC 81 MG tablet Take 81 mg by mouth daily.    Marland Kitchen atenolol (TENORMIN) 50 MG tablet Take 50 mg by mouth every evening.    Marland Kitchen atorvastatin (LIPITOR) 10 MG tablet Take 10 mg by mouth daily at 6 PM.     . cholecalciferol (VITAMIN D) 1000 UNITS tablet Take 1,000 Units by mouth daily.    . Cinnamon 500 MG capsule Take 500 mg by mouth every evening.    . isosorbide mononitrate (IMDUR) 30 MG 24 hr tablet Take 1 tablet (30 mg total) by mouth daily. 90 tablet 1  . levothyroxine (SYNTHROID, LEVOTHROID) 75 MCG tablet Take 75 mcg by mouth daily before breakfast.    . lisinopril-hydrochlorothiazide (PRINZIDE,ZESTORETIC) 20-12.5 MG per tablet Take 0.5-1 tablets by mouth 2 (two) times daily. Pt takes one tablet in the morning and one-half tablet at night.    . Melatonin 3 MG TABS Take 3 mg by mouth at bedtime as needed (sleep).     . metFORMIN (GLUCOPHAGE) 500 MG tablet Take 500 mg by mouth 2 (two) times daily with a meal.      . Multiple Vitamin (MULTIVITAMIN WITH MINERALS) TABS tablet Take 1 tablet by mouth daily.    . nitroGLYCERIN (NITROSTAT) 0.4 MG SL tablet Place 1 tablet (0.4 mg total) under the tongue every 5 (five) minutes as needed for chest pain. 25 tablet 3  . Omega-3 Fatty Acids (KP OMEGA-3 FISH OIL) 1200 MG CAPS Take 1,200 mg by mouth daily.    . pantoprazole (PROTONIX) 40 MG tablet Take 40 mg by mouth daily.     . Polyvinyl Alcohol-Povidone (REFRESH OP) Place 1 drop into both eyes daily.     No current facility-administered medications for this visit.     Past Medical History:  Diagnosis Date  . Coronary artery disease    LHC (02/09/14):  Mid LAD occluded, patent circumflex, mid RCA sequential 90%, LIMA-LAD patent radial Y graft to distal RCA patent, EF 60%  . Diabetes mellitus without complication ([place])   . Heart attack ([place]) 1999  . Hypertension   . PONV (postoperative nausea and vomiting)     Past Surgical History:  Procedure Laterality Date  . CARDIAC SURGERY    . COLONOSCOPY N/A 10/10/2016   Procedure: COLONOSCOPY;  Surgeon: Daneil Dolin, MD;  [facility];  Service: Endoscopy;  Laterality: N/A;  1:45 PM  . LAPAROSCOPIC CHOLECYSTECTOMY N/A 05/04/2016  .  LEFT HEART CATHETERIZATION WITH CORONARY/GRAFT ANGIOGRAM N/A 02/09/2014   Procedure: LEFT HEART CATHETERIZATION WITH Beatrix Fetters;  Surgeon: Jettie Booze, MD;  Location: [facility] CATH LAB;  Service: Cardiovascular;  Laterality: N/A;  . POLYPECTOMY  10/10/2016   Procedure: POLYPECTOMY;  Surgeon: Daneil Dolin, MD;  Location: [facility];  Service: Endoscopy;;  colon    ROS:   As stated in the HPI and negative for all other systems.  PHYSICAL EXAM BP 138/80   Pulse 62   Ht 5' (1.524 m)   Wt 134 lb (60.8 kg)   BMI 26.17 kg/m   GENERAL:  Well appearing NECK:  No jugular venous distention, waveform within normal limits, carotid upstroke brisk and symmetric, no bruits, no thyromegaly LYMPHATICS:  No cervical,  inguinal adenopathy LUNGS:  Clear to auscultation bilaterally BACK:  No CVA tenderness CHEST:  Unremarkable, mini thoracotomy HEART:  PMI not displaced or sustained,S1 and S2 within normal limits, no S3, no S4, no clicks, no rubs, no murmurs ABD:  Flat, positive bowel sounds normal in frequency in pitch, no bruits, no rebound, no guarding, no midline pulsatile mass, no hepatomegaly, no splenomegaly EXT:  2 plus pulses throughout, no edema, no cyanosis no clubbing arthritic changes   EKG:  Sinus rhythm, rate 62, old inferior infarct, probable old anterior,  lateral T-wave inversions unchanged from previous.  02/20/2017     ASSESSMENT AND PLAN  CAD s/p CABG: The patient has no new sypmtoms.  No further cardiovascular testing is indicated.  We will continue with aggressive risk reduction and meds as listed.  HTN:  The blood pressure is at target and she will continue her current meds.   DYSLIPIDEMIA:   I was able to review records from her primary provider. The LDL was 58 with an HDL of 48. She'll continue on the meds as listed  DM:  This is followed by Woody Seller.  She reports that the last A1C was 6.5.

## 2017-02-19 DIAGNOSIS — E119 Type 2 diabetes mellitus without complications: Secondary | ICD-10-CM | POA: Diagnosis not present

## 2017-02-19 DIAGNOSIS — I10 Essential (primary) hypertension: Secondary | ICD-10-CM | POA: Diagnosis not present

## 2017-02-19 DIAGNOSIS — I2510 Atherosclerotic heart disease of native coronary artery without angina pectoris: Secondary | ICD-10-CM | POA: Diagnosis not present

## 2017-02-20 ENCOUNTER — Encounter: Payer: Self-pay | Admitting: Cardiology

## 2017-02-20 ENCOUNTER — Ambulatory Visit (INDEPENDENT_AMBULATORY_CARE_PROVIDER_SITE_OTHER): Payer: PPO | Admitting: Cardiology

## 2017-02-20 VITALS — BP 138/80 | HR 62 | Ht 60.0 in | Wt 134.0 lb

## 2017-02-20 DIAGNOSIS — I10 Essential (primary) hypertension: Secondary | ICD-10-CM

## 2017-02-20 DIAGNOSIS — I2510 Atherosclerotic heart disease of native coronary artery without angina pectoris: Secondary | ICD-10-CM | POA: Diagnosis not present

## 2017-02-20 DIAGNOSIS — E785 Hyperlipidemia, unspecified: Secondary | ICD-10-CM | POA: Diagnosis not present

## 2017-02-20 NOTE — Patient Instructions (Signed)
Medication Instructions:  The current medical regimen is effective;  continue present plan and medications.  Follow-Up: Follow up in 1 year with Dr. Hochrein in [place].  You will receive a letter in the mail 2 months before you are due.  Please call us when you receive this letter to schedule your follow up appointment.  If you need a refill on your cardiac medications before your next appointment, please call your pharmacy.  Thank you for choosing [facility]!!     

## 2017-03-20 DIAGNOSIS — F419 Anxiety disorder, unspecified: Secondary | ICD-10-CM | POA: Diagnosis not present

## 2017-03-20 DIAGNOSIS — E1165 Type 2 diabetes mellitus with hyperglycemia: Secondary | ICD-10-CM | POA: Diagnosis not present

## 2017-03-20 DIAGNOSIS — I10 Essential (primary) hypertension: Secondary | ICD-10-CM | POA: Diagnosis not present

## 2017-03-20 DIAGNOSIS — E039 Hypothyroidism, unspecified: Secondary | ICD-10-CM | POA: Diagnosis not present

## 2017-03-20 DIAGNOSIS — M8580 Other specified disorders of bone density and structure, unspecified site: Secondary | ICD-10-CM | POA: Diagnosis not present

## 2017-03-20 DIAGNOSIS — I6529 Occlusion and stenosis of unspecified carotid artery: Secondary | ICD-10-CM | POA: Diagnosis not present

## 2017-03-20 DIAGNOSIS — E785 Hyperlipidemia, unspecified: Secondary | ICD-10-CM | POA: Diagnosis not present

## 2017-03-20 DIAGNOSIS — Z299 Encounter for prophylactic measures, unspecified: Secondary | ICD-10-CM | POA: Diagnosis not present

## 2017-03-20 DIAGNOSIS — I2510 Atherosclerotic heart disease of native coronary artery without angina pectoris: Secondary | ICD-10-CM | POA: Diagnosis not present

## 2017-03-20 DIAGNOSIS — Z6825 Body mass index (BMI) 25.0-25.9, adult: Secondary | ICD-10-CM | POA: Diagnosis not present

## 2017-03-22 DIAGNOSIS — I2510 Atherosclerotic heart disease of native coronary artery without angina pectoris: Secondary | ICD-10-CM | POA: Diagnosis not present

## 2017-03-22 DIAGNOSIS — I10 Essential (primary) hypertension: Secondary | ICD-10-CM | POA: Diagnosis not present

## 2017-03-22 DIAGNOSIS — E119 Type 2 diabetes mellitus without complications: Secondary | ICD-10-CM | POA: Diagnosis not present

## 2017-03-25 DIAGNOSIS — R0989 Other specified symptoms and signs involving the circulatory and respiratory systems: Secondary | ICD-10-CM | POA: Diagnosis not present

## 2017-03-25 DIAGNOSIS — I639 Cerebral infarction, unspecified: Secondary | ICD-10-CM | POA: Diagnosis not present

## 2017-03-25 DIAGNOSIS — I6350 Cerebral infarction due to unspecified occlusion or stenosis of unspecified cerebral artery: Secondary | ICD-10-CM | POA: Diagnosis not present

## 2017-04-02 DIAGNOSIS — M9904 Segmental and somatic dysfunction of sacral region: Secondary | ICD-10-CM | POA: Diagnosis not present

## 2017-04-02 DIAGNOSIS — M5137 Other intervertebral disc degeneration, lumbosacral region: Secondary | ICD-10-CM | POA: Diagnosis not present

## 2017-04-02 DIAGNOSIS — M9902 Segmental and somatic dysfunction of thoracic region: Secondary | ICD-10-CM | POA: Diagnosis not present

## 2017-04-02 DIAGNOSIS — M9903 Segmental and somatic dysfunction of lumbar region: Secondary | ICD-10-CM | POA: Diagnosis not present

## 2017-04-03 DIAGNOSIS — M5137 Other intervertebral disc degeneration, lumbosacral region: Secondary | ICD-10-CM | POA: Diagnosis not present

## 2017-04-03 DIAGNOSIS — M9902 Segmental and somatic dysfunction of thoracic region: Secondary | ICD-10-CM | POA: Diagnosis not present

## 2017-04-03 DIAGNOSIS — M9903 Segmental and somatic dysfunction of lumbar region: Secondary | ICD-10-CM | POA: Diagnosis not present

## 2017-04-03 DIAGNOSIS — M9904 Segmental and somatic dysfunction of sacral region: Secondary | ICD-10-CM | POA: Diagnosis not present

## 2017-04-04 DIAGNOSIS — M5137 Other intervertebral disc degeneration, lumbosacral region: Secondary | ICD-10-CM | POA: Diagnosis not present

## 2017-04-04 DIAGNOSIS — M9904 Segmental and somatic dysfunction of sacral region: Secondary | ICD-10-CM | POA: Diagnosis not present

## 2017-04-04 DIAGNOSIS — M9902 Segmental and somatic dysfunction of thoracic region: Secondary | ICD-10-CM | POA: Diagnosis not present

## 2017-04-04 DIAGNOSIS — M9903 Segmental and somatic dysfunction of lumbar region: Secondary | ICD-10-CM | POA: Diagnosis not present

## 2017-04-08 DIAGNOSIS — M5137 Other intervertebral disc degeneration, lumbosacral region: Secondary | ICD-10-CM | POA: Diagnosis not present

## 2017-04-08 DIAGNOSIS — M9904 Segmental and somatic dysfunction of sacral region: Secondary | ICD-10-CM | POA: Diagnosis not present

## 2017-04-08 DIAGNOSIS — M9903 Segmental and somatic dysfunction of lumbar region: Secondary | ICD-10-CM | POA: Diagnosis not present

## 2017-04-08 DIAGNOSIS — M9902 Segmental and somatic dysfunction of thoracic region: Secondary | ICD-10-CM | POA: Diagnosis not present

## 2017-04-17 DIAGNOSIS — E119 Type 2 diabetes mellitus without complications: Secondary | ICD-10-CM | POA: Diagnosis not present

## 2017-04-17 DIAGNOSIS — I10 Essential (primary) hypertension: Secondary | ICD-10-CM | POA: Diagnosis not present

## 2017-04-17 DIAGNOSIS — I2510 Atherosclerotic heart disease of native coronary artery without angina pectoris: Secondary | ICD-10-CM | POA: Diagnosis not present

## 2017-06-27 DIAGNOSIS — E1165 Type 2 diabetes mellitus with hyperglycemia: Secondary | ICD-10-CM | POA: Diagnosis not present

## 2017-06-27 DIAGNOSIS — I10 Essential (primary) hypertension: Secondary | ICD-10-CM | POA: Diagnosis not present

## 2017-06-27 DIAGNOSIS — E785 Hyperlipidemia, unspecified: Secondary | ICD-10-CM | POA: Diagnosis not present

## 2017-06-27 DIAGNOSIS — Z299 Encounter for prophylactic measures, unspecified: Secondary | ICD-10-CM | POA: Diagnosis not present

## 2017-06-27 DIAGNOSIS — Z6826 Body mass index (BMI) 26.0-26.9, adult: Secondary | ICD-10-CM | POA: Diagnosis not present

## 2017-06-27 DIAGNOSIS — I6529 Occlusion and stenosis of unspecified carotid artery: Secondary | ICD-10-CM | POA: Diagnosis not present

## 2017-07-02 ENCOUNTER — Ambulatory Visit (INDEPENDENT_AMBULATORY_CARE_PROVIDER_SITE_OTHER): Payer: PPO

## 2017-07-02 DIAGNOSIS — Z23 Encounter for immunization: Secondary | ICD-10-CM

## 2017-07-30 DIAGNOSIS — H35372 Puckering of macula, left eye: Secondary | ICD-10-CM | POA: Diagnosis not present

## 2017-07-30 DIAGNOSIS — D3132 Benign neoplasm of left choroid: Secondary | ICD-10-CM | POA: Diagnosis not present

## 2017-07-30 DIAGNOSIS — H35421 Microcystoid degeneration of retina, right eye: Secondary | ICD-10-CM | POA: Diagnosis not present

## 2017-07-30 DIAGNOSIS — H43813 Vitreous degeneration, bilateral: Secondary | ICD-10-CM | POA: Diagnosis not present

## 2017-08-21 DIAGNOSIS — I10 Essential (primary) hypertension: Secondary | ICD-10-CM | POA: Diagnosis not present

## 2017-08-21 DIAGNOSIS — I2510 Atherosclerotic heart disease of native coronary artery without angina pectoris: Secondary | ICD-10-CM | POA: Diagnosis not present

## 2017-08-21 DIAGNOSIS — E119 Type 2 diabetes mellitus without complications: Secondary | ICD-10-CM | POA: Diagnosis not present

## 2017-09-17 DIAGNOSIS — I10 Essential (primary) hypertension: Secondary | ICD-10-CM | POA: Diagnosis not present

## 2017-09-17 DIAGNOSIS — I2510 Atherosclerotic heart disease of native coronary artery without angina pectoris: Secondary | ICD-10-CM | POA: Diagnosis not present

## 2017-09-17 DIAGNOSIS — E119 Type 2 diabetes mellitus without complications: Secondary | ICD-10-CM | POA: Diagnosis not present

## 2017-10-10 DIAGNOSIS — E785 Hyperlipidemia, unspecified: Secondary | ICD-10-CM | POA: Diagnosis not present

## 2017-10-10 DIAGNOSIS — Z6826 Body mass index (BMI) 26.0-26.9, adult: Secondary | ICD-10-CM | POA: Diagnosis not present

## 2017-10-10 DIAGNOSIS — Z299 Encounter for prophylactic measures, unspecified: Secondary | ICD-10-CM | POA: Diagnosis not present

## 2017-10-10 DIAGNOSIS — E1165 Type 2 diabetes mellitus with hyperglycemia: Secondary | ICD-10-CM | POA: Diagnosis not present

## 2017-10-10 DIAGNOSIS — I10 Essential (primary) hypertension: Secondary | ICD-10-CM | POA: Diagnosis not present

## 2017-10-23 DIAGNOSIS — I2510 Atherosclerotic heart disease of native coronary artery without angina pectoris: Secondary | ICD-10-CM | POA: Diagnosis not present

## 2017-10-23 DIAGNOSIS — I10 Essential (primary) hypertension: Secondary | ICD-10-CM | POA: Diagnosis not present

## 2017-10-23 DIAGNOSIS — E119 Type 2 diabetes mellitus without complications: Secondary | ICD-10-CM | POA: Diagnosis not present

## 2017-11-15 DIAGNOSIS — I10 Essential (primary) hypertension: Secondary | ICD-10-CM | POA: Diagnosis not present

## 2017-11-15 DIAGNOSIS — I2510 Atherosclerotic heart disease of native coronary artery without angina pectoris: Secondary | ICD-10-CM | POA: Diagnosis not present

## 2017-11-15 DIAGNOSIS — E119 Type 2 diabetes mellitus without complications: Secondary | ICD-10-CM | POA: Diagnosis not present

## 2017-12-24 DIAGNOSIS — I2510 Atherosclerotic heart disease of native coronary artery without angina pectoris: Secondary | ICD-10-CM | POA: Diagnosis not present

## 2017-12-24 DIAGNOSIS — I10 Essential (primary) hypertension: Secondary | ICD-10-CM | POA: Diagnosis not present

## 2017-12-24 DIAGNOSIS — E119 Type 2 diabetes mellitus without complications: Secondary | ICD-10-CM | POA: Diagnosis not present

## 2018-01-01 DIAGNOSIS — M9902 Segmental and somatic dysfunction of thoracic region: Secondary | ICD-10-CM | POA: Diagnosis not present

## 2018-01-01 DIAGNOSIS — M9904 Segmental and somatic dysfunction of sacral region: Secondary | ICD-10-CM | POA: Diagnosis not present

## 2018-01-01 DIAGNOSIS — M9903 Segmental and somatic dysfunction of lumbar region: Secondary | ICD-10-CM | POA: Diagnosis not present

## 2018-01-01 DIAGNOSIS — M5137 Other intervertebral disc degeneration, lumbosacral region: Secondary | ICD-10-CM | POA: Diagnosis not present

## 2018-01-02 DIAGNOSIS — M5137 Other intervertebral disc degeneration, lumbosacral region: Secondary | ICD-10-CM | POA: Diagnosis not present

## 2018-01-02 DIAGNOSIS — M9902 Segmental and somatic dysfunction of thoracic region: Secondary | ICD-10-CM | POA: Diagnosis not present

## 2018-01-02 DIAGNOSIS — M9903 Segmental and somatic dysfunction of lumbar region: Secondary | ICD-10-CM | POA: Diagnosis not present

## 2018-01-02 DIAGNOSIS — M9904 Segmental and somatic dysfunction of sacral region: Secondary | ICD-10-CM | POA: Diagnosis not present

## 2018-01-06 DIAGNOSIS — M9902 Segmental and somatic dysfunction of thoracic region: Secondary | ICD-10-CM | POA: Diagnosis not present

## 2018-01-06 DIAGNOSIS — M5137 Other intervertebral disc degeneration, lumbosacral region: Secondary | ICD-10-CM | POA: Diagnosis not present

## 2018-01-06 DIAGNOSIS — M9903 Segmental and somatic dysfunction of lumbar region: Secondary | ICD-10-CM | POA: Diagnosis not present

## 2018-01-06 DIAGNOSIS — M9904 Segmental and somatic dysfunction of sacral region: Secondary | ICD-10-CM | POA: Diagnosis not present

## 2018-01-08 DIAGNOSIS — M9904 Segmental and somatic dysfunction of sacral region: Secondary | ICD-10-CM | POA: Diagnosis not present

## 2018-01-08 DIAGNOSIS — M9902 Segmental and somatic dysfunction of thoracic region: Secondary | ICD-10-CM | POA: Diagnosis not present

## 2018-01-08 DIAGNOSIS — M5137 Other intervertebral disc degeneration, lumbosacral region: Secondary | ICD-10-CM | POA: Diagnosis not present

## 2018-01-08 DIAGNOSIS — M9903 Segmental and somatic dysfunction of lumbar region: Secondary | ICD-10-CM | POA: Diagnosis not present

## 2018-01-13 DIAGNOSIS — Z6826 Body mass index (BMI) 26.0-26.9, adult: Secondary | ICD-10-CM | POA: Diagnosis not present

## 2018-01-13 DIAGNOSIS — I10 Essential (primary) hypertension: Secondary | ICD-10-CM | POA: Diagnosis not present

## 2018-01-13 DIAGNOSIS — E1165 Type 2 diabetes mellitus with hyperglycemia: Secondary | ICD-10-CM | POA: Diagnosis not present

## 2018-01-13 DIAGNOSIS — R21 Rash and other nonspecific skin eruption: Secondary | ICD-10-CM | POA: Diagnosis not present

## 2018-01-13 DIAGNOSIS — Z299 Encounter for prophylactic measures, unspecified: Secondary | ICD-10-CM | POA: Diagnosis not present

## 2018-01-14 DIAGNOSIS — I10 Essential (primary) hypertension: Secondary | ICD-10-CM | POA: Diagnosis not present

## 2018-01-14 DIAGNOSIS — R21 Rash and other nonspecific skin eruption: Secondary | ICD-10-CM | POA: Diagnosis not present

## 2018-01-14 DIAGNOSIS — R110 Nausea: Secondary | ICD-10-CM | POA: Diagnosis not present

## 2018-01-14 DIAGNOSIS — Z6826 Body mass index (BMI) 26.0-26.9, adult: Secondary | ICD-10-CM | POA: Diagnosis not present

## 2018-01-14 DIAGNOSIS — B029 Zoster without complications: Secondary | ICD-10-CM | POA: Diagnosis not present

## 2018-01-23 DIAGNOSIS — H5034 Intermittent alternating exotropia: Secondary | ICD-10-CM | POA: Diagnosis not present

## 2018-01-23 DIAGNOSIS — H5022 Vertical strabismus, left eye: Secondary | ICD-10-CM | POA: Diagnosis not present

## 2018-01-23 DIAGNOSIS — H52223 Regular astigmatism, bilateral: Secondary | ICD-10-CM | POA: Diagnosis not present

## 2018-01-24 DIAGNOSIS — Z1231 Encounter for screening mammogram for malignant neoplasm of breast: Secondary | ICD-10-CM | POA: Diagnosis not present

## 2018-02-10 DIAGNOSIS — Z7189 Other specified counseling: Secondary | ICD-10-CM | POA: Diagnosis not present

## 2018-02-10 DIAGNOSIS — Z789 Other specified health status: Secondary | ICD-10-CM | POA: Diagnosis not present

## 2018-02-10 DIAGNOSIS — I10 Essential (primary) hypertension: Secondary | ICD-10-CM | POA: Diagnosis not present

## 2018-02-10 DIAGNOSIS — E039 Hypothyroidism, unspecified: Secondary | ICD-10-CM | POA: Diagnosis not present

## 2018-02-10 DIAGNOSIS — I6529 Occlusion and stenosis of unspecified carotid artery: Secondary | ICD-10-CM | POA: Diagnosis not present

## 2018-02-10 DIAGNOSIS — E1165 Type 2 diabetes mellitus with hyperglycemia: Secondary | ICD-10-CM | POA: Diagnosis not present

## 2018-02-10 DIAGNOSIS — Z1331 Encounter for screening for depression: Secondary | ICD-10-CM | POA: Diagnosis not present

## 2018-02-10 DIAGNOSIS — E7800 Pure hypercholesterolemia, unspecified: Secondary | ICD-10-CM | POA: Diagnosis not present

## 2018-02-10 DIAGNOSIS — Z0000 Encounter for general adult medical examination without abnormal findings: Secondary | ICD-10-CM | POA: Diagnosis not present

## 2018-02-10 DIAGNOSIS — Z6825 Body mass index (BMI) 25.0-25.9, adult: Secondary | ICD-10-CM | POA: Diagnosis not present

## 2018-02-10 DIAGNOSIS — R5383 Other fatigue: Secondary | ICD-10-CM | POA: Diagnosis not present

## 2018-02-10 DIAGNOSIS — Z299 Encounter for prophylactic measures, unspecified: Secondary | ICD-10-CM | POA: Diagnosis not present

## 2018-02-10 DIAGNOSIS — Z79899 Other long term (current) drug therapy: Secondary | ICD-10-CM | POA: Diagnosis not present

## 2018-02-10 DIAGNOSIS — Z1339 Encounter for screening examination for other mental health and behavioral disorders: Secondary | ICD-10-CM | POA: Diagnosis not present

## 2018-02-10 DIAGNOSIS — Z1211 Encounter for screening for malignant neoplasm of colon: Secondary | ICD-10-CM | POA: Diagnosis not present

## 2018-02-17 DIAGNOSIS — E119 Type 2 diabetes mellitus without complications: Secondary | ICD-10-CM | POA: Diagnosis not present

## 2018-02-17 DIAGNOSIS — I10 Essential (primary) hypertension: Secondary | ICD-10-CM | POA: Diagnosis not present

## 2018-02-17 DIAGNOSIS — I2510 Atherosclerotic heart disease of native coronary artery without angina pectoris: Secondary | ICD-10-CM | POA: Diagnosis not present

## 2018-03-20 DIAGNOSIS — I10 Essential (primary) hypertension: Secondary | ICD-10-CM | POA: Diagnosis not present

## 2018-03-20 DIAGNOSIS — E119 Type 2 diabetes mellitus without complications: Secondary | ICD-10-CM | POA: Diagnosis not present

## 2018-03-20 DIAGNOSIS — I2510 Atherosclerotic heart disease of native coronary artery without angina pectoris: Secondary | ICD-10-CM | POA: Diagnosis not present

## 2018-03-24 NOTE — Progress Notes (Signed)
HPI The patient presents for followup of known coronary disease with CABG in 1999. She was in the hospital in June 2015. She had chest pain ruled out for myocardial infarction. She had patent bypass grafts on cath.  Since I last saw her she has had labile BPs.  She is reporting systolics in the 250N intermittently and she will take an increased dose of lisinopril.  Today her blood pressure is controlled and on other days it might be as well.  She is somewhat limited by back pain.  She does not get the previous back pain that was her angina.  She was but started doing better when she was started on Imdur.  She has not needed his sublingual nitroglycerin.  She denies any palpitations, presyncope or syncope.  She has had no new shortness of breath, PND or orthopnea.  Allergies  Allergen Reactions  . Ciprofloxacin Diarrhea and Nausea And Vomiting  . Codeine Other (See Comments)    Pt went to hospital for flu and states that kept getting worse with med.   . Erythromycin Diarrhea and Nausea And Vomiting  . Prednisone Nausea And Vomiting  . Sulfa Antibiotics Nausea And Vomiting    Current Outpatient Medications  Medication Sig Dispense Refill  . aspirin EC 81 MG tablet Take 81 mg by mouth daily.    Marland Kitchen atenolol (TENORMIN) 50 MG tablet Take 50 mg by mouth every evening.    Marland Kitchen atorvastatin (LIPITOR) 10 MG tablet Take 10 mg by mouth daily at 6 PM.     . cholecalciferol (VITAMIN D) 1000 UNITS tablet Take 1,000 Units by mouth daily.    . Cinnamon 500 MG capsule Take 500 mg by mouth every evening.    . isosorbide mononitrate (IMDUR) 30 MG 24 hr tablet Take 1 tablet (30 mg total) by mouth daily. 90 tablet 1  . levothyroxine (SYNTHROID, LEVOTHROID) 75 MCG tablet Take 75 mcg by mouth daily before breakfast.    . lisinopril-hydrochlorothiazide (PRINZIDE,ZESTORETIC) 20-12.5 MG per tablet Take 0.5-1 tablets by mouth 2 (two) times daily. Pt takes one tablet in the morning and one-half tablet at night.    .  Melatonin 3 MG TABS Take 3 mg by mouth at bedtime as needed (sleep).     . metFORMIN (GLUCOPHAGE) 500 MG tablet Take 500 mg by mouth 2 (two) times daily with a meal.     . Multiple Vitamin (MULTIVITAMIN WITH MINERALS) TABS tablet Take 1 tablet by mouth daily.    . nitroGLYCERIN (NITROSTAT) 0.4 MG SL tablet Place 1 tablet (0.4 mg total) under the tongue every 5 (five) minutes as needed for chest pain. 25 tablet 3  . Omega-3 Fatty Acids (KP OMEGA-3 FISH OIL) 1200 MG CAPS Take 1,200 mg by mouth daily.    . pantoprazole (PROTONIX) 40 MG tablet Take 40 mg by mouth daily.     . Polyvinyl Alcohol-Povidone (REFRESH OP) Place 1 drop into both eyes daily.     No current facility-administered medications for this visit.     Past Medical History:  Diagnosis Date  . Coronary artery disease    LHC (02/09/14):  Mid LAD occluded, patent circumflex, mid RCA sequential 90%, LIMA-LAD patent radial Y graft to distal RCA patent, EF 60%  . Diabetes mellitus without complication ([place])   . Heart attack ([place]) 1999  . Hypertension   . PONV (postoperative nausea and vomiting)     Past Surgical History:  Procedure Laterality Date  . CARDIAC SURGERY    .  COLONOSCOPY N/A 10/10/2016   Procedure: COLONOSCOPY;  Surgeon: Daneil Dolin, MD;  [facility];  Service: Endoscopy;  Laterality: N/A;  1:45 PM  . LAPAROSCOPIC CHOLECYSTECTOMY N/A 05/04/2016  . LEFT HEART CATHETERIZATION WITH CORONARY/GRAFT ANGIOGRAM N/A 02/09/2014   Procedure: LEFT HEART CATHETERIZATION WITH Beatrix Fetters;  Surgeon: Jettie Booze, MD;  Location: [facility] CATH LAB;  Service: Cardiovascular;  Laterality: N/A;  . POLYPECTOMY  10/10/2016   Procedure: POLYPECTOMY;  Surgeon: Daneil Dolin, MD;  Location: [facility];  Service: Endoscopy;;  colon    ROS:   Positive for anxiety.  Otherwise as stated in the HPI and negative for all other systems.  PHYSICAL EXAM BP 118/70   Pulse 66   Ht 5' (1.524 m)   Wt 135 lb (61.2 kg)    BMI 26.37 kg/m   GENERAL:  Well appearing NECK:  No jugular venous distention, waveform within normal limits, carotid upstroke brisk and symmetric, no bruits, no thyromegaly LUNGS:  Clear to auscultation bilaterally CHEST:  Unremarkable HEART:  PMI not displaced or sustained,S1 and S2 within normal limits, no S3, no S4, no clicks, no rubs, no murmurs ABD:  Flat, positive bowel sounds normal in frequency in pitch, no bruits, no rebound, no guarding, no midline pulsatile mass, no hepatomegaly, no splenomegaly EXT:  2 plus pulses throughout, no edema, no cyanosis no clubbing   EKG:  Sinus rhythm, rate 66, old inferior infarct, probable old anterior,  lateral T-wave inversions unchanged from previous.  03/26/2018     ASSESSMENT AND PLAN  CAD s/p CABG:   The patient has no new sypmtoms.  No further cardiovascular testing is indicated.  We will continue with aggressive risk reduction and meds as listed.    HTN:   Her blood pressure is labile.  I gave her instructions on keeping a blood pressure diary and she is going to return the results to me in 2 weeks.  She might need further adjustment to her medications.   DYSLIPIDEMIA: She is going to bring me a list of her lipids and I will make further suggestions based on this.  DM:    Her last A1c was 7.0.  We discussed changing her diet to include fewer carbohydrates and sugars.

## 2018-03-26 ENCOUNTER — Encounter: Payer: Self-pay | Admitting: Cardiology

## 2018-03-26 ENCOUNTER — Ambulatory Visit: Payer: PPO | Admitting: Cardiology

## 2018-03-26 VITALS — BP 118/70 | HR 66 | Ht 60.0 in | Wt 135.0 lb

## 2018-03-26 DIAGNOSIS — E785 Hyperlipidemia, unspecified: Secondary | ICD-10-CM

## 2018-03-26 DIAGNOSIS — E118 Type 2 diabetes mellitus with unspecified complications: Secondary | ICD-10-CM | POA: Diagnosis not present

## 2018-03-26 DIAGNOSIS — I2510 Atherosclerotic heart disease of native coronary artery without angina pectoris: Secondary | ICD-10-CM | POA: Diagnosis not present

## 2018-03-26 DIAGNOSIS — I10 Essential (primary) hypertension: Secondary | ICD-10-CM

## 2018-03-26 NOTE — Patient Instructions (Signed)
Medication Instructions:  The current medical regimen is effective;  continue present plan and medications.  Follow-Up: Follow up in 6 months with Dr. Percival Spanish.  You will receive a letter in the mail 2 months before you are due.  Please call us when you receive this letter to schedule your follow up appointment.  Any Other Special Instructions Will Be Listed Below (If Applicable). Please keep a blood pressure diary for 2 weeks and return it to the office for Dr Hochrein's review.  If you need a refill on your cardiac medications before your next appointment, please call your pharmacy.  Thank you for choosing [facility]!!

## 2018-04-11 DIAGNOSIS — E119 Type 2 diabetes mellitus without complications: Secondary | ICD-10-CM | POA: Diagnosis not present

## 2018-04-11 DIAGNOSIS — I10 Essential (primary) hypertension: Secondary | ICD-10-CM | POA: Diagnosis not present

## 2018-04-11 DIAGNOSIS — I2510 Atherosclerotic heart disease of native coronary artery without angina pectoris: Secondary | ICD-10-CM | POA: Diagnosis not present

## 2018-04-21 DIAGNOSIS — Z6826 Body mass index (BMI) 26.0-26.9, adult: Secondary | ICD-10-CM | POA: Diagnosis not present

## 2018-04-21 DIAGNOSIS — I2510 Atherosclerotic heart disease of native coronary artery without angina pectoris: Secondary | ICD-10-CM | POA: Diagnosis not present

## 2018-04-21 DIAGNOSIS — E1165 Type 2 diabetes mellitus with hyperglycemia: Secondary | ICD-10-CM | POA: Diagnosis not present

## 2018-04-21 DIAGNOSIS — I10 Essential (primary) hypertension: Secondary | ICD-10-CM | POA: Diagnosis not present

## 2018-04-21 DIAGNOSIS — Z299 Encounter for prophylactic measures, unspecified: Secondary | ICD-10-CM | POA: Diagnosis not present

## 2018-04-21 DIAGNOSIS — K219 Gastro-esophageal reflux disease without esophagitis: Secondary | ICD-10-CM | POA: Diagnosis not present

## 2018-04-28 DIAGNOSIS — Z6826 Body mass index (BMI) 26.0-26.9, adult: Secondary | ICD-10-CM | POA: Diagnosis not present

## 2018-04-28 DIAGNOSIS — Z299 Encounter for prophylactic measures, unspecified: Secondary | ICD-10-CM | POA: Diagnosis not present

## 2018-04-28 DIAGNOSIS — E1165 Type 2 diabetes mellitus with hyperglycemia: Secondary | ICD-10-CM | POA: Diagnosis not present

## 2018-04-28 DIAGNOSIS — I10 Essential (primary) hypertension: Secondary | ICD-10-CM | POA: Diagnosis not present

## 2018-04-28 DIAGNOSIS — F419 Anxiety disorder, unspecified: Secondary | ICD-10-CM | POA: Diagnosis not present

## 2018-05-05 ENCOUNTER — Telehealth: Payer: Self-pay | Admitting: Cardiology

## 2018-05-05 NOTE — Telephone Encounter (Signed)
Called patient, advised of her BP issues.  Patient stated that a week ago at church she has blurry vision, and felt light headed and she just 'went to sleep', patient passed out, EMS was called when she came too they check her BP and it was low at 60/38. They released her to go home if she seen PCP, patient made appointment to see PCP last Monday, he placed her on Xanax 0.25mg  as needed for anxiety, and took her off the Lisinopril-Hydrochlorothiazide medication, and gave her only Lisinopril 20mg  BID, patient also states she takes her Imdur and her atenolol 50 mg in the evening. Patient states since stopping the mixed medications she has felt some better, but BP continues to go up and down. 170/87 this morning.  Pt denies chest pains (does mention back pain), denies swelling, does mention having headaches, and SOB with activities. Patient was advised to keep a log of recent BP's and to be seen to discuss BP issues and to bring logs with patient with dates and times and if any symptoms occured. Patient made appointment with NP.  Patient verbalized understanding.

## 2018-05-05 NOTE — Telephone Encounter (Signed)
New Messagte   Pt c/o BP issue: STAT if pt c/o blurred vision, one-sided weakness or slurred speech  1. What are your last 5 BP readings? 174/94 this morning and before that 187/96 170/87,156/71 and lowest 60/38 when she passed out a week ago  2. Are you having any other symptoms (ex. Dizziness, headache, blurred vision, passed out)? Passed out a week ago   3. What is your BP issue? Pt states that her bp keeps going up and down and she is concerned

## 2018-05-07 ENCOUNTER — Ambulatory Visit: Payer: PPO | Admitting: Cardiology

## 2018-05-07 ENCOUNTER — Encounter: Payer: Self-pay | Admitting: Cardiology

## 2018-05-07 VITALS — BP 174/90 | HR 60 | Ht 60.0 in | Wt 135.0 lb

## 2018-05-07 DIAGNOSIS — I2510 Atherosclerotic heart disease of native coronary artery without angina pectoris: Secondary | ICD-10-CM | POA: Diagnosis not present

## 2018-05-07 DIAGNOSIS — R55 Syncope and collapse: Secondary | ICD-10-CM | POA: Diagnosis not present

## 2018-05-07 DIAGNOSIS — I10 Essential (primary) hypertension: Secondary | ICD-10-CM

## 2018-05-07 MED ORDER — AMLODIPINE BESYLATE 2.5 MG PO TABS: 2.5000 mg | ORAL_TABLET | Freq: Every day | ORAL | 3 refills | Status: DC

## 2018-05-07 NOTE — Progress Notes (Signed)
HPI The patient presents for followup of known coronary disease with CABG in 1999. She was in the hospital in June 2015. She had chest pain ruled out for myocardial infarction. She had patent bypass grafts on cath.  Since I last saw her she has had labile BPs.  I asked her to keep a BP diary.    She was added to my schedule because since that time she had a syncopal spell.  She was in church sitting.  I was able to look at EMS report and she had a normal rhythm but she was hypotensive when they got to her.  Her systolic was 18/56.  It subsequently came up to 127/63 and her symptoms went away and she did not want to be transported.  She saw her primary provider who stopped the hydrochlorothiazide.  Her pressures since have been elevated.  She is worried because now they are keeping up into the 160s 170s relatively routinely.  I did review a blood pressure diary.  She has not been getting any chest pressure or neck discomfort.  She has noted some back pain when her blood pressure goes up.  He has not needed any nitroglycerin.  She has not felt palpitations and she has had no further syncope.  She is just been weak and tired.  She occasionally has some dizziness.   Allergies  Allergen Reactions  . Ciprofloxacin Diarrhea and Nausea And Vomiting  . Codeine Other (See Comments)    Pt went to hospital for flu and states that kept getting worse with med.   . Erythromycin Diarrhea and Nausea And Vomiting  . Prednisone Nausea And Vomiting  . Sulfa Antibiotics Nausea And Vomiting    Current Outpatient Medications  Medication Sig Dispense Refill  . ALPRAZolam (XANAX) 0.25 MG tablet Take 0.25 mg by mouth 2 (two) times daily as needed.    Marland Kitchen aspirin EC 81 MG tablet Take 81 mg by mouth daily.    Marland Kitchen atenolol (TENORMIN) 50 MG tablet Take 50 mg by mouth every evening.    Marland Kitchen atorvastatin (LIPITOR) 10 MG tablet Take 10 mg by mouth daily at 6 PM.     . cholecalciferol (VITAMIN D) 1000 UNITS tablet Take 1,000  Units by mouth daily.    . Cinnamon 500 MG capsule Take 500 mg by mouth every evening.    . isosorbide mononitrate (IMDUR) 30 MG 24 hr tablet Take 1 tablet (30 mg total) by mouth daily. 90 tablet 1  . levothyroxine (SYNTHROID, LEVOTHROID) 75 MCG tablet Take 75 mcg by mouth daily before breakfast.    . lisinopril (PRINIVIL,ZESTRIL) 20 MG tablet Take 20 mg by mouth 2 (two) times daily.    . Melatonin 3 MG TABS Take 3 mg by mouth at bedtime as needed (sleep).     . metFORMIN (GLUCOPHAGE) 500 MG tablet Take 500 mg by mouth 2 (two) times daily with a meal.     . Multiple Vitamin (MULTIVITAMIN WITH MINERALS) TABS tablet Take 1 tablet by mouth daily.    . nitroGLYCERIN (NITROSTAT) 0.4 MG SL tablet Place 1 tablet (0.4 mg total) under the tongue every 5 (five) minutes as needed for chest pain. 25 tablet 3  . Omega-3 Fatty Acids (KP OMEGA-3 FISH OIL) 1200 MG CAPS Take 1,200 mg by mouth daily.    . pantoprazole (PROTONIX) 40 MG tablet Take 40 mg by mouth daily.     . Polyvinyl Alcohol-Povidone (REFRESH OP) Place 1 drop into both eyes daily.  No current facility-administered medications for this visit.     Past Medical History:  Diagnosis Date  . Coronary artery disease    LHC (02/09/14):  Mid LAD occluded, patent circumflex, mid RCA sequential 90%, LIMA-LAD patent radial Y graft to distal RCA patent, EF 60%  . Diabetes mellitus without complication ([place])   . Heart attack ([place]) 1999  . Hypertension   . PONV (postoperative nausea and vomiting)     Past Surgical History:  Procedure Laterality Date  . CARDIAC SURGERY    . COLONOSCOPY N/A 10/10/2016   Procedure: COLONOSCOPY;  Surgeon: Daneil Dolin, MD;  [facility];  Service: Endoscopy;  Laterality: N/A;  1:45 PM  . LAPAROSCOPIC CHOLECYSTECTOMY N/A 05/04/2016  . LEFT HEART CATHETERIZATION WITH CORONARY/GRAFT ANGIOGRAM N/A 02/09/2014   Procedure: LEFT HEART CATHETERIZATION WITH Beatrix Fetters;  Surgeon: Jettie Booze, MD;   Location: [facility] CATH LAB;  Service: Cardiovascular;  Laterality: N/A;  . POLYPECTOMY  10/10/2016   Procedure: POLYPECTOMY;  Surgeon: Daneil Dolin, MD;  [facility];  Service: Endoscopy;;  colon    ROS:   As stated in the HPI and negative for all other systems.  PHYSICAL EXAM BP (!) 174/90   Pulse 60   Ht 5' (1.524 m)   Wt 135 lb (61.2 kg)   BMI 26.37 kg/m   GENERAL:  Well appearing NECK:  No jugular venous distention, waveform within normal limits, carotid upstroke brisk and symmetric, no bruits, no thyromegaly LUNGS:  Clear to auscultation bilaterally CHEST:  Unremarkable HEART:  PMI not displaced or sustained,S1 and S2 within normal limits, no S3, no S4, no clicks, no rubs, no murmurs ABD:  Flat, positive bowel sounds normal in frequency in pitch, no bruits, no rebound, no guarding, no midline pulsatile mass, no hepatomegaly, no splenomegaly EXT:  2 plus pulses throughout, no edema, no cyanosis no clubbing   EKG:  Sinus rhythm, rate 56 , old inferior infarct, probable old anterior,  lateral T-wave inversions unchanged from previous.  No from previous.  05/07/2018     ASSESSMENT AND PLAN  CAD s/p CABG:   She is not having any symptoms consistent with angina.  At this point I am not planning ischemia work-up but I will continue with medical management.  HTN:   I agree with stopping hydrochlorothiazide but I am going to try to add Norvasc 2.5 mg daily to her meds.  SYNCOPE: She was hypotensive and may have had a vagal episode.  I do not suspect a dysrhythmia.  None was recorded at the time of her symptoms.  No further evaluation unless she has recurrent episodes.

## 2018-05-07 NOTE — Progress Notes (Deleted)
Cardiology Office Note   Date:  05/07/2018   ID:  Ariel Rodriguez, DOB 03-02-1931, MRN [id number]  PCP:  Glenda Chroman, MD  Cardiologist:  [facility] chief complaint on file.    History of Present Illness: Ariel Rodriguez is a 84 y.o. female who presents for ongoing assessment and management of CAD, with hx of CABG in 1999, Repeat cardiac cath in 2015 demonstrated patent bypass grafts. She was seen last by Dr. Percival Spanish om 03/26/2018 and was found to have labile BP. She was to take her BP at home and bring the recorded data back on follow up appointment.   She called our office on 05/05/2018 with elevated BP 174/94 and 187/96.  She also reported that she felt lightheaded and passed out at church. BP was as low as 60/38 recorded by EMS who arrived on the scene.She had been placed on Xanax 0.25 mg by PCP for anxiety. . She had been taking all of her other medications to include Imdur, Lisinopril 20 mg BID.   (copied for accuracy from 05/05/2018 phone note).  Patient states since stopping the mixed medications she has felt some better, but BP continues to go up and down. 170/87 this morning. Pt denied chest pains (does mention back pain), denies swelling, does mention having headaches, and SOB with activities. Patient was advised to keep a log of recent BP's and to be seen to discuss BP issues and to bring logs with patient with dates and times and if any symptoms occured. Patient made appointment with NP.  Past Medical History:  Diagnosis Date  . Coronary artery disease    LHC (02/09/14):  Mid LAD occluded, patent circumflex, mid RCA sequential 90%, LIMA-LAD patent radial Y graft to distal RCA patent, EF 60%  . Diabetes mellitus without complication ([place])   . Heart attack ([place]) 1999  . Hypertension   . PONV (postoperative nausea and vomiting)     Past Surgical History:  Procedure Laterality Date  . CARDIAC SURGERY    . COLONOSCOPY N/A 10/10/2016   Procedure: COLONOSCOPY;  Surgeon: Daneil Dolin, MD;  [facility];  Service: Endoscopy;  Laterality: N/A;  1:45 PM  . LAPAROSCOPIC CHOLECYSTECTOMY N/A 05/04/2016  . LEFT HEART CATHETERIZATION WITH CORONARY/GRAFT ANGIOGRAM N/A 02/09/2014   Procedure: LEFT HEART CATHETERIZATION WITH Beatrix Fetters;  Surgeon: Jettie Booze, MD;  Location: [facility] CATH LAB;  Service: Cardiovascular;  Laterality: N/A;  . POLYPECTOMY  10/10/2016   Procedure: POLYPECTOMY;  Surgeon: Daneil Dolin, MD;  [facility];  Service: Endoscopy;;  colon     Current Outpatient Medications  Medication Sig Dispense Refill  . aspirin EC 81 MG tablet Take 81 mg by mouth daily.    Marland Kitchen atenolol (TENORMIN) 50 MG tablet Take 50 mg by mouth every evening.    Marland Kitchen atorvastatin (LIPITOR) 10 MG tablet Take 10 mg by mouth daily at 6 PM.     . cholecalciferol (VITAMIN D) 1000 UNITS tablet Take 1,000 Units by mouth daily.    . Cinnamon 500 MG capsule Take 500 mg by mouth every evening.    . isosorbide mononitrate (IMDUR) 30 MG 24 hr tablet Take 1 tablet (30 mg total) by mouth daily. 90 tablet 1  . levothyroxine (SYNTHROID, LEVOTHROID) 75 MCG tablet Take 75 mcg by mouth daily before breakfast.    . lisinopril-hydrochlorothiazide (PRINZIDE,ZESTORETIC) 20-12.5 MG per tablet Take 0.5-1 tablets by mouth 2 (two) times daily. Pt takes one tablet in the  morning and one-half tablet at night.    . Melatonin 3 MG TABS Take 3 mg by mouth at bedtime as needed (sleep).     . metFORMIN (GLUCOPHAGE) 500 MG tablet Take 500 mg by mouth 2 (two) times daily with a meal.     . Multiple Vitamin (MULTIVITAMIN WITH MINERALS) TABS tablet Take 1 tablet by mouth daily.    . nitroGLYCERIN (NITROSTAT) 0.4 MG SL tablet Place 1 tablet (0.4 mg total) under the tongue every 5 (five) minutes as needed for chest pain. 25 tablet 3  . Omega-3 Fatty Acids (KP OMEGA-3 FISH OIL) 1200 MG CAPS Take 1,200 mg by mouth daily.    . pantoprazole (PROTONIX) 40 MG tablet Take 40 mg by mouth daily.       . Polyvinyl Alcohol-Povidone (REFRESH OP) Place 1 drop into both eyes daily.     No current facility-administered medications for this visit.     Allergies:   Ciprofloxacin; Codeine; Erythromycin; Prednisone; and Sulfa antibiotics    Social History:  The patient  reports that she has never smoked. She has never used smokeless tobacco. She reports that she does not drink alcohol or use drugs.   Family History:  The patient's family history is not on file.    ROS: All other systems are reviewed and negative. Unless otherwise mentioned in H&P    PHYSICAL EXAM: VS:  There were no vitals taken for this visit. , BMI There is no height or weight on file to calculate BMI. GEN: Well nourished, well developed, in no acute distress  HEENT: normal  Neck: no JVD, carotid bruits, or masses Cardiac: ***RRR; no murmurs, rubs, or gallops,no edema  Respiratory:  clear to auscultation bilaterally, normal work of breathing GI: soft, nontender, nondistended, + BS MS: no deformity or atrophy  Skin: warm and dry, no rash Neuro:  Strength and sensation are intact Psych: euthymic mood, full affect   EKG:  EKG {ACTION; IS/IS [id number]} ordered today. The ekg ordered today demonstrates ***   Recent Labs: No results found for requested labs within last 8760 hours.    Lipid Panel    Component Value Date/Time   CHOL 149 02/09/2014 0315   TRIG 208 (H) 02/09/2014 0315   HDL 44 02/09/2014 0315   CHOLHDL 3.4 02/09/2014 0315   VLDL 42 (H) 02/09/2014 0315   LDLCALC 63 02/09/2014 0315      Wt Readings from Last 3 Encounters:  03/26/18 135 lb (61.2 kg)  02/20/17 134 lb (60.8 kg)  10/01/16 132 lb 3.2 oz (60 kg)      Other studies Reviewed: Additional studies/ records that were reviewed today include: ***. Review of the above records demonstrates: ***   ASSESSMENT AND PLAN:  1.  ***   Current medicines are reviewed at length with the patient today.    Labs/ tests ordered today  include: *** Phill Myron. [place] Pugh, ANP, AACC   05/07/2018 10:15 AM    [facility] [address] [place], [place] [address] Phone: [phone]; Fax: [phone]

## 2018-05-07 NOTE — Patient Instructions (Signed)
Medication Instructions:  Please start Amlodipine 2.5 mg daily. Continue all other medications as listed.  Follow-Up: Follow up in 1 month with Dr Percival Spanish in [place].  If you need a refill on your cardiac medications before your next appointment, please call your pharmacy.  Thank you for choosing [facility]!!

## 2018-05-08 ENCOUNTER — Ambulatory Visit: Payer: PPO | Admitting: Adult Health

## 2018-06-03 DIAGNOSIS — I10 Essential (primary) hypertension: Secondary | ICD-10-CM | POA: Diagnosis not present

## 2018-06-03 DIAGNOSIS — E119 Type 2 diabetes mellitus without complications: Secondary | ICD-10-CM | POA: Diagnosis not present

## 2018-06-03 DIAGNOSIS — I2510 Atherosclerotic heart disease of native coronary artery without angina pectoris: Secondary | ICD-10-CM | POA: Diagnosis not present

## 2018-06-25 DIAGNOSIS — I2510 Atherosclerotic heart disease of native coronary artery without angina pectoris: Secondary | ICD-10-CM | POA: Diagnosis not present

## 2018-06-25 DIAGNOSIS — I10 Essential (primary) hypertension: Secondary | ICD-10-CM | POA: Diagnosis not present

## 2018-06-25 DIAGNOSIS — E119 Type 2 diabetes mellitus without complications: Secondary | ICD-10-CM | POA: Diagnosis not present

## 2018-07-08 NOTE — Progress Notes (Signed)
HPI The patient presents for followup of known coronary disease with CABG in 1999. She was in the hospital in June 2015. She had chest pain ruled out for myocardial infarction. She had patent bypass grafts on cath.  Since I last saw her she has had labile BPs.  I asked her to keep a BP diary.  I saw her in August after a syncopal spell.  This was thought possibly to be vagal.  She returns for follow up.    Since I last saw her she has had no new cardiovascular complaints.  She denies any further syncopal episodes.  She has no significant orthostatic symptoms or she gets up slowly when she moves.  She remains active.  She denies any chest pressure, neck or arm discomfort.  She is not noticing any palpitations, presyncope or syncope.  He brings a blood pressure diary since I switched her off of hydrochlorothiazide and started amlodipine last time.  Her blood pressures have been up and down a little bit but well controlled for the most part.  Allergies  Allergen Reactions  . Ciprofloxacin Diarrhea and Nausea And Vomiting  . Codeine Other (See Comments)    Pt went to hospital for flu and states that kept getting worse with med.   . Erythromycin Diarrhea and Nausea And Vomiting  . Prednisone Nausea And Vomiting  . Sulfa Antibiotics Nausea And Vomiting    Current Outpatient Medications  Medication Sig Dispense Refill  . ALPRAZolam (XANAX) 0.25 MG tablet Take 0.25 mg by mouth 2 (two) times daily as needed.    Marland Kitchen amLODipine (NORVASC) 2.5 MG tablet Take 1 tablet (2.5 mg total) by mouth daily. 90 tablet 3  . aspirin EC 81 MG tablet Take 81 mg by mouth daily.    Marland Kitchen atenolol (TENORMIN) 50 MG tablet Take 50 mg by mouth every evening.    Marland Kitchen atorvastatin (LIPITOR) 10 MG tablet Take 10 mg by mouth daily at 6 PM.     . cholecalciferol (VITAMIN D) 1000 UNITS tablet Take 1,000 Units by mouth daily.    . Cinnamon 500 MG capsule Take 500 mg by mouth every evening.    . isosorbide mononitrate (IMDUR) 30 MG 24  hr tablet Take 1 tablet (30 mg total) by mouth daily. 90 tablet 1  . levothyroxine (SYNTHROID, LEVOTHROID) 75 MCG tablet Take 75 mcg by mouth daily before breakfast.    . lisinopril (PRINIVIL,ZESTRIL) 20 MG tablet Take 20 mg by mouth 2 (two) times daily.    . Melatonin 3 MG TABS Take 3 mg by mouth at bedtime as needed (sleep).     . metFORMIN (GLUCOPHAGE) 500 MG tablet Take 500 mg by mouth 2 (two) times daily with a meal.     . Multiple Vitamin (MULTIVITAMIN WITH MINERALS) TABS tablet Take 1 tablet by mouth daily.    . nitroGLYCERIN (NITROSTAT) 0.4 MG SL tablet Place 1 tablet (0.4 mg total) under the tongue every 5 (five) minutes as needed for chest pain. 25 tablet 3  . Omega-3 Fatty Acids (KP OMEGA-3 FISH OIL) 1200 MG CAPS Take 1,200 mg by mouth daily.    . pantoprazole (PROTONIX) 40 MG tablet Take 40 mg by mouth daily.     . Polyvinyl Alcohol-Povidone (REFRESH OP) Place 1 drop into both eyes daily.     No current facility-administered medications for this visit.     Past Medical History:  Diagnosis Date  . Coronary artery disease    LHC (02/09/14):  Mid  LAD occluded, patent circumflex, mid RCA sequential 90%, LIMA-LAD patent radial Y graft to distal RCA patent, EF 60%  . Diabetes mellitus without complication ([place])   . Heart attack ([place]) 1999  . Hypertension   . PONV (postoperative nausea and vomiting)     Past Surgical History:  Procedure Laterality Date  . CARDIAC SURGERY    . COLONOSCOPY N/A 10/10/2016   Procedure: COLONOSCOPY;  Surgeon: Daneil Dolin, MD;  [facility];  Service: Endoscopy;  Laterality: N/A;  1:45 PM  . LAPAROSCOPIC CHOLECYSTECTOMY N/A 05/04/2016  . LEFT HEART CATHETERIZATION WITH CORONARY/GRAFT ANGIOGRAM N/A 02/09/2014   Procedure: LEFT HEART CATHETERIZATION WITH Beatrix Fetters;  Surgeon: Jettie Booze, MD;  Location: [facility] CATH LAB;  Service: Cardiovascular;  Laterality: N/A;  . POLYPECTOMY  10/10/2016   Procedure: POLYPECTOMY;  Surgeon:  Daneil Dolin, MD;  [facility];  Service: Endoscopy;;  colon    ROS:   As stated in the HPI and negative for all other systems.  PHYSICAL EXAM BP 124/70   Pulse (!) 52   Ht 5' (1.524 m)   Wt 136 lb (61.7 kg)   BMI 26.56 kg/m   GENERAL:  Well appearing NECK:  No jugular venous distention, waveform within normal limits, carotid upstroke brisk and symmetric, no bruits, no thyromegaly LUNGS:  Clear to auscultation bilaterally CHEST:  Unremarkable HEART:  PMI not displaced or sustained,S1 and S2 within normal limits, no S3, no S4, no clicks, no rubs, no murmurs ABD:  Flat, positive bowel sounds normal in frequency in pitch, no bruits, no rebound, no guarding, no midline pulsatile mass, no hepatomegaly, no splenomegaly EXT:  2 plus pulses throughout, no edema, no cyanosis no clubbing    ASSESSMENT AND PLAN  CAD s/p CABG:   The patient has no new sypmtoms.  No further cardiovascular testing is indicated.  We will continue with aggressive risk reduction and meds as listed.  HTN:    Her blood pressure is well controlled and she will continue the meds as listed.  SYNCOPE:    She is had no further events.  No further work-up.

## 2018-07-09 ENCOUNTER — Encounter: Payer: Self-pay | Admitting: Cardiology

## 2018-07-09 ENCOUNTER — Ambulatory Visit: Payer: PPO | Admitting: Cardiology

## 2018-07-09 VITALS — BP 124/70 | HR 52 | Ht 60.0 in | Wt 136.0 lb

## 2018-07-09 DIAGNOSIS — I10 Essential (primary) hypertension: Secondary | ICD-10-CM

## 2018-07-09 DIAGNOSIS — R55 Syncope and collapse: Secondary | ICD-10-CM

## 2018-07-09 DIAGNOSIS — I2510 Atherosclerotic heart disease of native coronary artery without angina pectoris: Secondary | ICD-10-CM

## 2018-07-09 NOTE — Patient Instructions (Signed)
Medication Instructions:  The current medical regimen is effective;  continue present plan and medications.  If you need a refill on your cardiac medications before your next appointment, please call your pharmacy.   Follow-Up: . Follow up in 1 year with Dr. Percival Spanish in [place].   Please call in August 2020 for an October 2020 follow up appointment.  Thank you for choosing [facility]!!

## 2018-07-11 ENCOUNTER — Other Ambulatory Visit: Payer: Self-pay | Admitting: Cardiology

## 2018-07-11 ENCOUNTER — Ambulatory Visit: Payer: PPO

## 2018-07-23 DIAGNOSIS — I2510 Atherosclerotic heart disease of native coronary artery without angina pectoris: Secondary | ICD-10-CM | POA: Diagnosis not present

## 2018-07-23 DIAGNOSIS — E119 Type 2 diabetes mellitus without complications: Secondary | ICD-10-CM | POA: Diagnosis not present

## 2018-07-23 DIAGNOSIS — I10 Essential (primary) hypertension: Secondary | ICD-10-CM | POA: Diagnosis not present

## 2018-07-28 DIAGNOSIS — Z299 Encounter for prophylactic measures, unspecified: Secondary | ICD-10-CM | POA: Diagnosis not present

## 2018-07-28 DIAGNOSIS — E1165 Type 2 diabetes mellitus with hyperglycemia: Secondary | ICD-10-CM | POA: Diagnosis not present

## 2018-07-28 DIAGNOSIS — Z6826 Body mass index (BMI) 26.0-26.9, adult: Secondary | ICD-10-CM | POA: Diagnosis not present

## 2018-07-28 DIAGNOSIS — F320 Major depressive disorder, single episode, mild: Secondary | ICD-10-CM | POA: Diagnosis not present

## 2018-07-28 DIAGNOSIS — I10 Essential (primary) hypertension: Secondary | ICD-10-CM | POA: Diagnosis not present

## 2018-08-12 DIAGNOSIS — H35361 Drusen (degenerative) of macula, right eye: Secondary | ICD-10-CM | POA: Diagnosis not present

## 2018-08-12 DIAGNOSIS — H35372 Puckering of macula, left eye: Secondary | ICD-10-CM | POA: Diagnosis not present

## 2018-08-12 DIAGNOSIS — H43822 Vitreomacular adhesion, left eye: Secondary | ICD-10-CM | POA: Diagnosis not present

## 2018-08-12 DIAGNOSIS — D3132 Benign neoplasm of left choroid: Secondary | ICD-10-CM | POA: Diagnosis not present

## 2018-08-27 DIAGNOSIS — E119 Type 2 diabetes mellitus without complications: Secondary | ICD-10-CM | POA: Diagnosis not present

## 2018-08-27 DIAGNOSIS — I10 Essential (primary) hypertension: Secondary | ICD-10-CM | POA: Diagnosis not present

## 2018-08-27 DIAGNOSIS — I2510 Atherosclerotic heart disease of native coronary artery without angina pectoris: Secondary | ICD-10-CM | POA: Diagnosis not present

## 2018-09-15 DIAGNOSIS — M9903 Segmental and somatic dysfunction of lumbar region: Secondary | ICD-10-CM | POA: Diagnosis not present

## 2018-09-15 DIAGNOSIS — M9902 Segmental and somatic dysfunction of thoracic region: Secondary | ICD-10-CM | POA: Diagnosis not present

## 2018-09-15 DIAGNOSIS — M9904 Segmental and somatic dysfunction of sacral region: Secondary | ICD-10-CM | POA: Diagnosis not present

## 2018-09-15 DIAGNOSIS — M5137 Other intervertebral disc degeneration, lumbosacral region: Secondary | ICD-10-CM | POA: Diagnosis not present

## 2018-09-17 DIAGNOSIS — M9903 Segmental and somatic dysfunction of lumbar region: Secondary | ICD-10-CM | POA: Diagnosis not present

## 2018-09-17 DIAGNOSIS — M9904 Segmental and somatic dysfunction of sacral region: Secondary | ICD-10-CM | POA: Diagnosis not present

## 2018-09-17 DIAGNOSIS — M9902 Segmental and somatic dysfunction of thoracic region: Secondary | ICD-10-CM | POA: Diagnosis not present

## 2018-09-17 DIAGNOSIS — M5137 Other intervertebral disc degeneration, lumbosacral region: Secondary | ICD-10-CM | POA: Diagnosis not present

## 2018-09-18 DIAGNOSIS — M9904 Segmental and somatic dysfunction of sacral region: Secondary | ICD-10-CM | POA: Diagnosis not present

## 2018-09-18 DIAGNOSIS — M5137 Other intervertebral disc degeneration, lumbosacral region: Secondary | ICD-10-CM | POA: Diagnosis not present

## 2018-09-18 DIAGNOSIS — M9903 Segmental and somatic dysfunction of lumbar region: Secondary | ICD-10-CM | POA: Diagnosis not present

## 2018-09-18 DIAGNOSIS — M9902 Segmental and somatic dysfunction of thoracic region: Secondary | ICD-10-CM | POA: Diagnosis not present

## 2018-10-01 DIAGNOSIS — M5416 Radiculopathy, lumbar region: Secondary | ICD-10-CM | POA: Diagnosis not present

## 2018-10-16 DIAGNOSIS — I10 Essential (primary) hypertension: Secondary | ICD-10-CM | POA: Diagnosis not present

## 2018-10-16 DIAGNOSIS — E119 Type 2 diabetes mellitus without complications: Secondary | ICD-10-CM | POA: Diagnosis not present

## 2018-10-16 DIAGNOSIS — I2510 Atherosclerotic heart disease of native coronary artery without angina pectoris: Secondary | ICD-10-CM | POA: Diagnosis not present

## 2018-10-21 DIAGNOSIS — M9903 Segmental and somatic dysfunction of lumbar region: Secondary | ICD-10-CM | POA: Diagnosis not present

## 2018-10-21 DIAGNOSIS — M5440 Lumbago with sciatica, unspecified side: Secondary | ICD-10-CM | POA: Diagnosis not present

## 2018-10-21 DIAGNOSIS — M47816 Spondylosis without myelopathy or radiculopathy, lumbar region: Secondary | ICD-10-CM | POA: Diagnosis not present

## 2018-10-22 DIAGNOSIS — M9903 Segmental and somatic dysfunction of lumbar region: Secondary | ICD-10-CM | POA: Diagnosis not present

## 2018-10-22 DIAGNOSIS — M47816 Spondylosis without myelopathy or radiculopathy, lumbar region: Secondary | ICD-10-CM | POA: Diagnosis not present

## 2018-10-22 DIAGNOSIS — M5440 Lumbago with sciatica, unspecified side: Secondary | ICD-10-CM | POA: Diagnosis not present

## 2018-10-23 DIAGNOSIS — M5440 Lumbago with sciatica, unspecified side: Secondary | ICD-10-CM | POA: Diagnosis not present

## 2018-10-23 DIAGNOSIS — M9903 Segmental and somatic dysfunction of lumbar region: Secondary | ICD-10-CM | POA: Diagnosis not present

## 2018-10-23 DIAGNOSIS — M47816 Spondylosis without myelopathy or radiculopathy, lumbar region: Secondary | ICD-10-CM | POA: Diagnosis not present

## 2018-10-27 DIAGNOSIS — M9903 Segmental and somatic dysfunction of lumbar region: Secondary | ICD-10-CM | POA: Diagnosis not present

## 2018-10-27 DIAGNOSIS — M47816 Spondylosis without myelopathy or radiculopathy, lumbar region: Secondary | ICD-10-CM | POA: Diagnosis not present

## 2018-10-30 DIAGNOSIS — M9903 Segmental and somatic dysfunction of lumbar region: Secondary | ICD-10-CM | POA: Diagnosis not present

## 2018-10-30 DIAGNOSIS — M47816 Spondylosis without myelopathy or radiculopathy, lumbar region: Secondary | ICD-10-CM | POA: Diagnosis not present

## 2018-11-03 DIAGNOSIS — I10 Essential (primary) hypertension: Secondary | ICD-10-CM | POA: Diagnosis not present

## 2018-11-03 DIAGNOSIS — E1165 Type 2 diabetes mellitus with hyperglycemia: Secondary | ICD-10-CM | POA: Diagnosis not present

## 2018-11-03 DIAGNOSIS — Z713 Dietary counseling and surveillance: Secondary | ICD-10-CM | POA: Diagnosis not present

## 2018-11-03 DIAGNOSIS — Z6826 Body mass index (BMI) 26.0-26.9, adult: Secondary | ICD-10-CM | POA: Diagnosis not present

## 2018-11-03 DIAGNOSIS — Z299 Encounter for prophylactic measures, unspecified: Secondary | ICD-10-CM | POA: Diagnosis not present

## 2018-11-04 DIAGNOSIS — M47816 Spondylosis without myelopathy or radiculopathy, lumbar region: Secondary | ICD-10-CM | POA: Diagnosis not present

## 2018-11-04 DIAGNOSIS — M9903 Segmental and somatic dysfunction of lumbar region: Secondary | ICD-10-CM | POA: Diagnosis not present

## 2018-11-10 DIAGNOSIS — M47816 Spondylosis without myelopathy or radiculopathy, lumbar region: Secondary | ICD-10-CM | POA: Diagnosis not present

## 2018-11-10 DIAGNOSIS — M9903 Segmental and somatic dysfunction of lumbar region: Secondary | ICD-10-CM | POA: Diagnosis not present

## 2018-11-12 DIAGNOSIS — I2510 Atherosclerotic heart disease of native coronary artery without angina pectoris: Secondary | ICD-10-CM | POA: Diagnosis not present

## 2018-11-12 DIAGNOSIS — I10 Essential (primary) hypertension: Secondary | ICD-10-CM | POA: Diagnosis not present

## 2018-11-12 DIAGNOSIS — E119 Type 2 diabetes mellitus without complications: Secondary | ICD-10-CM | POA: Diagnosis not present

## 2018-11-17 DIAGNOSIS — M47816 Spondylosis without myelopathy or radiculopathy, lumbar region: Secondary | ICD-10-CM | POA: Diagnosis not present

## 2018-11-17 DIAGNOSIS — M5440 Lumbago with sciatica, unspecified side: Secondary | ICD-10-CM | POA: Diagnosis not present

## 2018-11-17 DIAGNOSIS — M9903 Segmental and somatic dysfunction of lumbar region: Secondary | ICD-10-CM | POA: Diagnosis not present

## 2018-12-24 DIAGNOSIS — I2510 Atherosclerotic heart disease of native coronary artery without angina pectoris: Secondary | ICD-10-CM | POA: Diagnosis not present

## 2018-12-24 DIAGNOSIS — E119 Type 2 diabetes mellitus without complications: Secondary | ICD-10-CM | POA: Diagnosis not present

## 2018-12-24 DIAGNOSIS — I10 Essential (primary) hypertension: Secondary | ICD-10-CM | POA: Diagnosis not present

## 2019-01-20 DIAGNOSIS — E119 Type 2 diabetes mellitus without complications: Secondary | ICD-10-CM | POA: Diagnosis not present

## 2019-01-20 DIAGNOSIS — I10 Essential (primary) hypertension: Secondary | ICD-10-CM | POA: Diagnosis not present

## 2019-01-20 DIAGNOSIS — I2510 Atherosclerotic heart disease of native coronary artery without angina pectoris: Secondary | ICD-10-CM | POA: Diagnosis not present

## 2019-02-16 DIAGNOSIS — Z1339 Encounter for screening examination for other mental health and behavioral disorders: Secondary | ICD-10-CM | POA: Diagnosis not present

## 2019-02-16 DIAGNOSIS — E7800 Pure hypercholesterolemia, unspecified: Secondary | ICD-10-CM | POA: Diagnosis not present

## 2019-02-16 DIAGNOSIS — E1165 Type 2 diabetes mellitus with hyperglycemia: Secondary | ICD-10-CM | POA: Diagnosis not present

## 2019-02-16 DIAGNOSIS — Z79899 Other long term (current) drug therapy: Secondary | ICD-10-CM | POA: Diagnosis not present

## 2019-02-16 DIAGNOSIS — Z1211 Encounter for screening for malignant neoplasm of colon: Secondary | ICD-10-CM | POA: Diagnosis not present

## 2019-02-16 DIAGNOSIS — I10 Essential (primary) hypertension: Secondary | ICD-10-CM | POA: Diagnosis not present

## 2019-02-16 DIAGNOSIS — Z299 Encounter for prophylactic measures, unspecified: Secondary | ICD-10-CM | POA: Diagnosis not present

## 2019-02-16 DIAGNOSIS — Z1331 Encounter for screening for depression: Secondary | ICD-10-CM | POA: Diagnosis not present

## 2019-02-16 DIAGNOSIS — Z0000 Encounter for general adult medical examination without abnormal findings: Secondary | ICD-10-CM | POA: Diagnosis not present

## 2019-02-16 DIAGNOSIS — Z6825 Body mass index (BMI) 25.0-25.9, adult: Secondary | ICD-10-CM | POA: Diagnosis not present

## 2019-02-16 DIAGNOSIS — F419 Anxiety disorder, unspecified: Secondary | ICD-10-CM | POA: Diagnosis not present

## 2019-02-16 DIAGNOSIS — Z7189 Other specified counseling: Secondary | ICD-10-CM | POA: Diagnosis not present

## 2019-02-16 DIAGNOSIS — R5383 Other fatigue: Secondary | ICD-10-CM | POA: Diagnosis not present

## 2019-02-19 DIAGNOSIS — I2510 Atherosclerotic heart disease of native coronary artery without angina pectoris: Secondary | ICD-10-CM | POA: Diagnosis not present

## 2019-02-19 DIAGNOSIS — E119 Type 2 diabetes mellitus without complications: Secondary | ICD-10-CM | POA: Diagnosis not present

## 2019-02-19 DIAGNOSIS — I10 Essential (primary) hypertension: Secondary | ICD-10-CM | POA: Diagnosis not present

## 2019-02-23 DIAGNOSIS — M5136 Other intervertebral disc degeneration, lumbar region: Secondary | ICD-10-CM | POA: Diagnosis not present

## 2019-02-23 DIAGNOSIS — G894 Chronic pain syndrome: Secondary | ICD-10-CM | POA: Diagnosis not present

## 2019-02-23 DIAGNOSIS — M5416 Radiculopathy, lumbar region: Secondary | ICD-10-CM | POA: Diagnosis not present

## 2019-02-26 DIAGNOSIS — E119 Type 2 diabetes mellitus without complications: Secondary | ICD-10-CM | POA: Diagnosis not present

## 2019-02-26 DIAGNOSIS — M79676 Pain in unspecified toe(s): Secondary | ICD-10-CM | POA: Diagnosis not present

## 2019-02-26 DIAGNOSIS — B351 Tinea unguium: Secondary | ICD-10-CM | POA: Diagnosis not present

## 2019-03-10 DIAGNOSIS — E1165 Type 2 diabetes mellitus with hyperglycemia: Secondary | ICD-10-CM | POA: Diagnosis not present

## 2019-03-11 DIAGNOSIS — M47816 Spondylosis without myelopathy or radiculopathy, lumbar region: Secondary | ICD-10-CM | POA: Diagnosis not present

## 2019-03-11 DIAGNOSIS — M5440 Lumbago with sciatica, unspecified side: Secondary | ICD-10-CM | POA: Diagnosis not present

## 2019-03-11 DIAGNOSIS — M9903 Segmental and somatic dysfunction of lumbar region: Secondary | ICD-10-CM | POA: Diagnosis not present

## 2019-03-13 DIAGNOSIS — M9903 Segmental and somatic dysfunction of lumbar region: Secondary | ICD-10-CM | POA: Diagnosis not present

## 2019-03-13 DIAGNOSIS — M47816 Spondylosis without myelopathy or radiculopathy, lumbar region: Secondary | ICD-10-CM | POA: Diagnosis not present

## 2019-03-13 DIAGNOSIS — M5440 Lumbago with sciatica, unspecified side: Secondary | ICD-10-CM | POA: Diagnosis not present

## 2019-03-16 DIAGNOSIS — M9903 Segmental and somatic dysfunction of lumbar region: Secondary | ICD-10-CM | POA: Diagnosis not present

## 2019-03-16 DIAGNOSIS — M47816 Spondylosis without myelopathy or radiculopathy, lumbar region: Secondary | ICD-10-CM | POA: Diagnosis not present

## 2019-03-16 DIAGNOSIS — M5440 Lumbago with sciatica, unspecified side: Secondary | ICD-10-CM | POA: Diagnosis not present

## 2019-03-17 DIAGNOSIS — M47816 Spondylosis without myelopathy or radiculopathy, lumbar region: Secondary | ICD-10-CM | POA: Diagnosis not present

## 2019-03-17 DIAGNOSIS — M5440 Lumbago with sciatica, unspecified side: Secondary | ICD-10-CM | POA: Diagnosis not present

## 2019-03-17 DIAGNOSIS — M9903 Segmental and somatic dysfunction of lumbar region: Secondary | ICD-10-CM | POA: Diagnosis not present

## 2019-03-19 DIAGNOSIS — M5440 Lumbago with sciatica, unspecified side: Secondary | ICD-10-CM | POA: Diagnosis not present

## 2019-03-19 DIAGNOSIS — M9903 Segmental and somatic dysfunction of lumbar region: Secondary | ICD-10-CM | POA: Diagnosis not present

## 2019-03-19 DIAGNOSIS — M47816 Spondylosis without myelopathy or radiculopathy, lumbar region: Secondary | ICD-10-CM | POA: Diagnosis not present

## 2019-03-23 DIAGNOSIS — M9903 Segmental and somatic dysfunction of lumbar region: Secondary | ICD-10-CM | POA: Diagnosis not present

## 2019-03-23 DIAGNOSIS — M47816 Spondylosis without myelopathy or radiculopathy, lumbar region: Secondary | ICD-10-CM | POA: Diagnosis not present

## 2019-03-23 DIAGNOSIS — M5440 Lumbago with sciatica, unspecified side: Secondary | ICD-10-CM | POA: Diagnosis not present

## 2019-03-26 DIAGNOSIS — M47816 Spondylosis without myelopathy or radiculopathy, lumbar region: Secondary | ICD-10-CM | POA: Diagnosis not present

## 2019-03-26 DIAGNOSIS — M9903 Segmental and somatic dysfunction of lumbar region: Secondary | ICD-10-CM | POA: Diagnosis not present

## 2019-03-26 DIAGNOSIS — M5440 Lumbago with sciatica, unspecified side: Secondary | ICD-10-CM | POA: Diagnosis not present

## 2019-03-30 DIAGNOSIS — M9903 Segmental and somatic dysfunction of lumbar region: Secondary | ICD-10-CM | POA: Diagnosis not present

## 2019-03-30 DIAGNOSIS — M5440 Lumbago with sciatica, unspecified side: Secondary | ICD-10-CM | POA: Diagnosis not present

## 2019-03-30 DIAGNOSIS — M47816 Spondylosis without myelopathy or radiculopathy, lumbar region: Secondary | ICD-10-CM | POA: Diagnosis not present

## 2019-04-01 DIAGNOSIS — I10 Essential (primary) hypertension: Secondary | ICD-10-CM | POA: Diagnosis not present

## 2019-04-01 DIAGNOSIS — I2510 Atherosclerotic heart disease of native coronary artery without angina pectoris: Secondary | ICD-10-CM | POA: Diagnosis not present

## 2019-04-01 DIAGNOSIS — E119 Type 2 diabetes mellitus without complications: Secondary | ICD-10-CM | POA: Diagnosis not present

## 2019-04-01 DIAGNOSIS — M5440 Lumbago with sciatica, unspecified side: Secondary | ICD-10-CM | POA: Diagnosis not present

## 2019-04-01 DIAGNOSIS — M9903 Segmental and somatic dysfunction of lumbar region: Secondary | ICD-10-CM | POA: Diagnosis not present

## 2019-04-01 DIAGNOSIS — M47816 Spondylosis without myelopathy or radiculopathy, lumbar region: Secondary | ICD-10-CM | POA: Diagnosis not present

## 2019-04-06 DIAGNOSIS — M47816 Spondylosis without myelopathy or radiculopathy, lumbar region: Secondary | ICD-10-CM | POA: Diagnosis not present

## 2019-04-06 DIAGNOSIS — M9903 Segmental and somatic dysfunction of lumbar region: Secondary | ICD-10-CM | POA: Diagnosis not present

## 2019-04-06 DIAGNOSIS — M5440 Lumbago with sciatica, unspecified side: Secondary | ICD-10-CM | POA: Diagnosis not present

## 2019-04-08 DIAGNOSIS — I10 Essential (primary) hypertension: Secondary | ICD-10-CM | POA: Diagnosis not present

## 2019-04-08 DIAGNOSIS — Z299 Encounter for prophylactic measures, unspecified: Secondary | ICD-10-CM | POA: Diagnosis not present

## 2019-04-08 DIAGNOSIS — Z6825 Body mass index (BMI) 25.0-25.9, adult: Secondary | ICD-10-CM | POA: Diagnosis not present

## 2019-04-08 DIAGNOSIS — Z789 Other specified health status: Secondary | ICD-10-CM | POA: Diagnosis not present

## 2019-04-08 DIAGNOSIS — L309 Dermatitis, unspecified: Secondary | ICD-10-CM | POA: Diagnosis not present

## 2019-04-08 DIAGNOSIS — E1165 Type 2 diabetes mellitus with hyperglycemia: Secondary | ICD-10-CM | POA: Diagnosis not present

## 2019-04-16 DIAGNOSIS — Z713 Dietary counseling and surveillance: Secondary | ICD-10-CM | POA: Diagnosis not present

## 2019-04-16 DIAGNOSIS — Z6825 Body mass index (BMI) 25.0-25.9, adult: Secondary | ICD-10-CM | POA: Diagnosis not present

## 2019-04-16 DIAGNOSIS — R197 Diarrhea, unspecified: Secondary | ICD-10-CM | POA: Diagnosis not present

## 2019-04-16 DIAGNOSIS — E1165 Type 2 diabetes mellitus with hyperglycemia: Secondary | ICD-10-CM | POA: Diagnosis not present

## 2019-04-16 DIAGNOSIS — R110 Nausea: Secondary | ICD-10-CM | POA: Diagnosis not present

## 2019-04-16 DIAGNOSIS — I10 Essential (primary) hypertension: Secondary | ICD-10-CM | POA: Diagnosis not present

## 2019-04-18 ENCOUNTER — Emergency Department (HOSPITAL_COMMUNITY): Payer: PPO

## 2019-04-18 ENCOUNTER — Encounter (HOSPITAL_COMMUNITY): Payer: Self-pay | Admitting: Emergency Medicine

## 2019-04-18 ENCOUNTER — Other Ambulatory Visit: Payer: Self-pay

## 2019-04-18 ENCOUNTER — Emergency Department (HOSPITAL_COMMUNITY)
Admission: EM | Admit: 2019-04-18 | Discharge: 2019-04-18 | Disposition: A | Discharge status: Home / Self Care | Payer: PPO | Attending: Emergency Medicine | Admitting: Emergency Medicine

## 2019-04-18 DIAGNOSIS — I10 Essential (primary) hypertension: Secondary | ICD-10-CM | POA: Diagnosis not present

## 2019-04-18 DIAGNOSIS — I25700 Atherosclerosis of coronary artery bypass graft(s), unspecified, with unstable angina pectoris: Secondary | ICD-10-CM | POA: Diagnosis not present

## 2019-04-18 DIAGNOSIS — Z79899 Other long term (current) drug therapy: Secondary | ICD-10-CM | POA: Diagnosis not present

## 2019-04-18 DIAGNOSIS — I252 Old myocardial infarction: Secondary | ICD-10-CM | POA: Diagnosis not present

## 2019-04-18 DIAGNOSIS — Z7982 Long term (current) use of aspirin: Secondary | ICD-10-CM | POA: Diagnosis not present

## 2019-04-18 DIAGNOSIS — K5730 Diverticulosis of large intestine without perforation or abscess without bleeding: Secondary | ICD-10-CM | POA: Insufficient documentation

## 2019-04-18 DIAGNOSIS — E119 Type 2 diabetes mellitus without complications: Secondary | ICD-10-CM | POA: Insufficient documentation

## 2019-04-18 DIAGNOSIS — R197 Diarrhea, unspecified: Secondary | ICD-10-CM | POA: Diagnosis not present

## 2019-04-18 DIAGNOSIS — Z7984 Long term (current) use of oral hypoglycemic drugs: Secondary | ICD-10-CM | POA: Insufficient documentation

## 2019-04-18 LAB — GASTROINTESTINAL PANEL BY PCR, STOOL (REPLACES STOOL CULTURE)

## 2019-04-18 LAB — CBC WITH DIFFERENTIAL/PLATELET
Abs Immature Granulocytes: 0.03 10*3/uL (ref 0.00–0.07)
Basophils Absolute: 0.1 10*3/uL (ref 0.0–0.1)
Basophils Relative: 1 %
Eosinophils Absolute: 0.3 10*3/uL (ref 0.0–0.5)
Eosinophils Relative: 4 %
HCT: 37.4 % (ref 36.0–46.0)
Hemoglobin: 12.6 g/dL (ref 12.0–15.0)
Immature Granulocytes: 0 %
Lymphocytes Relative: 22 %
Lymphs Abs: 1.8 10*3/uL (ref 0.7–4.0)
MCH: 31.2 pg (ref 26.0–34.0)
MCHC: 33.7 g/dL (ref 30.0–36.0)
MCV: 92.6 fL (ref 80.0–100.0)
Monocytes Absolute: 0.7 10*3/uL (ref 0.1–1.0)
Monocytes Relative: 9 %
Neutro Abs: 5.3 10*3/uL (ref 1.7–7.7)
Neutrophils Relative %: 64 %
Platelets: 187 10*3/uL (ref 150–400)
RBC: 4.04 MIL/uL (ref 3.87–5.11)
RDW: 12.5 % (ref 11.5–15.5)
WBC: 8.1 10*3/uL (ref 4.0–10.5)
nRBC: 0 % (ref 0.0–0.2)

## 2019-04-18 LAB — URINALYSIS, ROUTINE W REFLEX MICROSCOPIC
Bilirubin Urine: NEGATIVE
Glucose, UA: NEGATIVE mg/dL
Hgb urine dipstick: NEGATIVE
Ketones, ur: NEGATIVE mg/dL
Nitrite: NEGATIVE
Protein, ur: NEGATIVE mg/dL
Specific Gravity, Urine: 1.005 (ref 1.005–1.030)
pH: 7 (ref 5.0–8.0)

## 2019-04-18 LAB — COMPREHENSIVE METABOLIC PANEL
ALT: 23 U/L (ref 0–44)
AST: 24 U/L (ref 15–41)
Albumin: 4.5 g/dL (ref 3.5–5.0)
Alkaline Phosphatase: 44 U/L (ref 38–126)
Anion gap: 12 (ref 5–15)
BUN: 12 mg/dL (ref 8–23)
CO2: 24 mmol/L (ref 22–32)
Calcium: 9.3 mg/dL (ref 8.9–10.3)
Chloride: 96 mmol/L — ABNORMAL LOW (ref 98–111)
Creatinine, Ser: 0.87 mg/dL (ref 0.44–1.00)
GFR calc Af Amer: >60 mL/min (ref >60)
GFR calc non Af Amer: >60 mL/min (ref >60)
Glucose, Bld: 148 mg/dL — ABNORMAL HIGH (ref 70–99)
Potassium: 4 mmol/L (ref 3.5–5.1)
Sodium: 132 mmol/L — ABNORMAL LOW (ref 135–145)
Total Bilirubin: 0.6 mg/dL (ref 0.3–1.2)
Total Protein: 7.8 g/dL (ref 6.5–8.1)

## 2019-04-18 LAB — C DIFFICILE QUICK SCREEN W PCR REFLEX: C Diff antigen: NEGATIVE

## 2019-04-18 LAB — LIPASE, BLOOD: Lipase: 27 U/L (ref 11–51)

## 2019-04-18 LAB — C DIFFICILE QUICK SCREEN W PCR REFLEX??
C Diff interpretation: NOT DETECTED
C Diff toxin: NEGATIVE

## 2019-04-18 MED ORDER — IOHEXOL 300 MG/ML  SOLN
100.0000 mL | Freq: Once | INTRAMUSCULAR | Status: AC | PRN
  Administered 2019-04-18: 10:00:00 100 mL via INTRAVENOUS

## 2019-04-18 MED ORDER — ONDANSETRON HCL 4 MG/2ML IJ SOLN: 4.0000 mg | INTRAMUSCULAR | Status: DC | PRN

## 2019-04-18 NOTE — ED Triage Notes (Signed)
Pt reports diarrhea for the past 2 months intermittently. States she gets up and her first bowel movement is normal and then loose through the rest of the day. No pain.  Called pcp and was given abx on Wednesday which are making her sick despite nausea medication.

## 2019-04-18 NOTE — Discharge Instructions (Addendum)
Take your usual prescriptions as directed.  Increase your fluid intake (ie:  Gatoraide) for the next few days  Eat a bland diet and advance to your regular diet slowly as you can tolerate it.   Avoid full strength juices, as well as milk and milk products until your diarrhea has resolved.   Call your regular medical doctor and your GI doctor Monday to schedule a follow up appointment this week.  Return to the Emergency Department immediately sooner if worsening.

## 2019-04-18 NOTE — ED Provider Notes (Signed)
[facility] EMERGENCY DEPARTMENT Provider Note   CSN: [id number] Arrival date & time: 04/18/19  0815     History   Chief Complaint Chief Complaint  Patient presents with   Diarrhea    HPI Ariel Rodriguez is a 86 y.o. female.     HPI Pt was seen at [place]. Per pt, c/o gradual onset and persistence of multiple intermittent episodes of diarrhea for the past 2 months. Pt states the diarrhea "has been worse" over the past 2 weeks. Has been associated with nausea. Pt states she called her PMD 3 days ago regarding her symptoms, rx abx. States she has only taken one day of the antibiotic without improvement of her symptoms. Pt states her first stool is "normal" that "it becomes loose the rest of the day unless I take something." Pt has been taking imodium with intermittent improvement of her symptoms. Denies black or blood in stools, denies abd pain, no CP/SOB, no back pain, no fevers, no vomiting, no others with similar symptoms.    Past Medical History:  Diagnosis Date   Coronary artery disease    LHC (02/09/14):  Mid LAD occluded, patent circumflex, mid RCA sequential 90%, LIMA-LAD patent radial Y graft to distal RCA patent, EF 60%   Diabetes mellitus without complication ([place])    Heart attack ([place]) 1999   Hypertension    PONV (postoperative nausea and vomiting)     Patient Active Problem List   Diagnosis Date Noted   Syncope 05/07/2018   Dyslipidemia 03/26/2018   Heme + stool 10/01/2016   PONV (postoperative nausea and vomiting) 10/01/2016   Nausea with vomiting 06/19/2016   Old MI (myocardial infarction) 02/09/2014   Coronary atherosclerosis of native coronary artery 02/09/2014   Hx of CABG 02/09/2014   Type II or unspecified type diabetes mellitus without mention of complication, not stated as uncontrolled 02/09/2014   Pure hypercholesterolemia 02/09/2014   Essential hypertension, benign 02/09/2014   Unstable angina pectoris ([place]) 02/08/2014    Past  Surgical History:  Procedure Laterality Date   CARDIAC SURGERY     COLONOSCOPY N/A 10/10/2016   Procedure: COLONOSCOPY;  Surgeon: Daneil Dolin, MD;  Location: [facility] ENDO [facility];  Service: Endoscopy;  Laterality: N/A;  1:45 PM   LAPAROSCOPIC CHOLECYSTECTOMY N/A 05/04/2016   LEFT HEART CATHETERIZATION WITH CORONARY/GRAFT ANGIOGRAM N/A 02/09/2014   Procedure: LEFT HEART CATHETERIZATION WITH Beatrix Fetters;  Surgeon: Jettie Booze, MD;  Location: [facility] CATH LAB;  Service: Cardiovascular;  Laterality: N/A;   POLYPECTOMY  10/10/2016   Procedure: POLYPECTOMY;  Surgeon: Daneil Dolin, MD;  Location: [facility] ENDO SUITE;  Service: Endoscopy;;  colon     OB History   No obstetric history on file.      Home Medications    Prior to Admission medications   Medication Sig Start Date End Date Taking? Authorizing Provider  ALPRAZolam (XANAX) 0.25 MG tablet Take 0.25 mg by mouth 2 (two) times daily as needed.    [provider]  amLODipine (NORVASC) 2.5 MG tablet Take 1 tablet (2.5 mg total) by mouth daily. 05/07/18   Minus Breeding, MD  aspirin EC 81 MG tablet Take 81 mg by mouth daily.    [provider]  atenolol (TENORMIN) 50 MG tablet Take 50 mg by mouth every evening.    [provider]  atorvastatin (LIPITOR) 10 MG tablet Take 10 mg by mouth daily at 6 PM.     [provider]  cholecalciferol (VITAMIN D) 1000 UNITS tablet  Take 1,000 Units by mouth daily.    [provider]  Cinnamon 500 MG capsule Take 500 mg by mouth every evening.    [provider]  isosorbide mononitrate (IMDUR) 30 MG 24 hr tablet Take 1 tablet (30 mg total) by mouth daily. 05/26/15   Minus Breeding, MD  levothyroxine (SYNTHROID, LEVOTHROID) 75 MCG tablet Take 75 mcg by mouth daily before breakfast.    [provider]  lisinopril (PRINIVIL,ZESTRIL) 20 MG tablet Take 20 mg by mouth 2 (two) times daily.    [provider]  Melatonin 3 MG TABS Take  3 mg by mouth at bedtime as needed (sleep).     [provider]  metFORMIN (GLUCOPHAGE) 500 MG tablet Take 500 mg by mouth 2 (two) times daily with a meal.     [provider]  Multiple Vitamin (MULTIVITAMIN WITH MINERALS) TABS tablet Take 1 tablet by mouth daily.    [provider]  nitroGLYCERIN (NITROSTAT) 0.4 MG SL tablet PLACE 1 TABLET UNDER THE TONGUE AT ONSET OF CHEST PAIN EVERY 5 MINTUES UP TO 3 TIMES AS NEEDED 07/11/18   Minus Breeding, MD  Omega-3 Fatty Acids (KP OMEGA-3 FISH OIL) 1200 MG CAPS Take 1,200 mg by mouth daily.    [provider]  pantoprazole (PROTONIX) 40 MG tablet Take 40 mg by mouth daily.     [provider]  Polyvinyl Alcohol-Povidone (REFRESH OP) Place 1 drop into both eyes daily.    [provider]    Family History History reviewed. No pertinent family history.  Social History Social History   Tobacco Use   Smoking status: Never Smoker   Smokeless tobacco: Never Used  Substance Use Topics   Alcohol use: No   Drug use: No     Allergies   Ciprofloxacin, Codeine, Erythromycin, Prednisone, and Sulfa antibiotics   Review of Systems Review of Systems ROS: Statement: All systems negative except as marked or noted in the HPI; Constitutional: Negative for fever and chills. ; ; Eyes: Negative for eye pain, redness and discharge. ; ; ENMT: Negative for ear pain, hoarseness, nasal congestion, sinus pressure and sore throat. ; ; Cardiovascular: Negative for chest pain, palpitations, diaphoresis, dyspnea and peripheral edema. ; ; Respiratory: Negative for cough, wheezing and stridor. ; ; Gastrointestinal: +nausea, diarrhea. Negative for vomiting, abdominal pain, blood in stool, hematemesis, jaundice and rectal bleeding. . ; ; Genitourinary: Negative for dysuria, flank pain and hematuria. ; ; Musculoskeletal: Negative for back pain and neck pain. Negative for swelling and trauma.; ; Skin: Negative for pruritus,  rash, abrasions, blisters, bruising and skin lesion.; ; Neuro: Negative for headache, lightheadedness and neck stiffness. Negative for weakness, altered level of consciousness, altered mental status, extremity weakness, paresthesias, involuntary movement, seizure and syncope.      Physical Exam Updated Vital Signs BP (!) 172/73 (BP Location: Left Arm)    Pulse 63    Temp 98.2 F (36.8 C) (Oral)    Resp 18    Ht 5' (1.524 m)    Wt 59 kg    SpO2 100%    BMI 25.39 kg/m     09:02 Orthostatic Vital Signs LL  Orthostatic Lying   BP- Lying: 148/63  Pulse- Lying: 55      Orthostatic Sitting  BP- Sitting: 125/64  Pulse- Sitting: 61      Orthostatic Standing at 3 minutes  BP- Standing at 3 minutes: 122/65  Pulse- Standing at 3 minutes: 63     Physical  Exam 0830: Physical examination:  Nursing notes reviewed; Vital signs and O2 SAT reviewed;  Constitutional: Well developed, Well nourished, Well hydrated, In no acute distress; Head:  Normocephalic, atraumatic; Eyes: EOMI, PERRL, No scleral icterus; ENMT: Mouth and pharynx normal, Mucous membranes moist; Neck: Supple, Full range of motion, No lymphadenopathy; Cardiovascular: Regular rate and rhythm, No gallop; Respiratory: Breath sounds clear & equal bilaterally, No wheezes.  Speaking full sentences with ease, Normal respiratory effort/excursion; Chest: Nontender, Movement normal; Abdomen: Soft, Nontender, Nondistended, Normal bowel sounds; Genitourinary: No CVA tenderness; Extremities: Peripheral pulses normal, No tenderness, No edema, No calf edema or asymmetry.; Neuro: AA&Ox3, Major CN grossly intact.  Speech clear. No gross focal motor or sensory deficits in extremities. Climbs on and off stretcher easily by herself. Gait steady..; Skin: Color normal, Warm, Dry.   ED Treatments / Results  Labs (all labs ordered are listed, but only abnormal results are displayed)   EKG None  Radiology   Procedures Procedures (including critical  care time)  Medications Ordered in ED Medications - No data to display   Initial Impression / Assessment and Plan / ED Course  I have reviewed the triage vital signs and the nursing notes.  Pertinent labs & imaging results that were available during my care of the patient were reviewed by me and considered in my medical decision making (see chart for details).    MDM Reviewed: previous chart, nursing note and vitals Reviewed previous: labs Interpretation: labs, x-ray and CT scan   Results for orders placed or performed during the hospital encounter of 04/18/19  C difficile quick scan w PCR reflex   Specimen: Anal; Stool  Result Value Ref Range   C Diff antigen NEGATIVE NEGATIVE   C Diff toxin NEGATIVE NEGATIVE   C Diff interpretation No C. difficile detected.   Comprehensive metabolic panel  Result Value Ref Range   Sodium 132 (L) 135 - 145 mmol/L   Potassium 4.0 3.5 - 5.1 mmol/L   Chloride 96 (L) 98 - 111 mmol/L   CO2 24 22 - 32 mmol/L   Glucose, Bld 148 (H) 70 - 99 mg/dL   BUN 12 8 - 23 mg/dL   Creatinine, Ser 0.87 0.44 - 1.00 mg/dL   Calcium 9.3 8.9 - 10.3 mg/dL   Total Protein 7.8 6.5 - 8.1 g/dL   Albumin 4.5 3.5 - 5.0 g/dL   AST 24 15 - 41 U/L   ALT 23 0 - 44 U/L   Alkaline Phosphatase 44 38 - 126 U/L   Total Bilirubin 0.6 0.3 - 1.2 mg/dL   GFR calc non Af Amer >60 >60 mL/min   GFR calc Af Amer >60 >60 mL/min   Anion gap 12 5 - 15  Lipase, blood  Result Value Ref Range   Lipase 27 11 - 51 U/L  CBC with Differential  Result Value Ref Range   WBC 8.1 4.0 - 10.5 K/uL   RBC 4.04 3.87 - 5.11 MIL/uL   Hemoglobin 12.6 12.0 - 15.0 g/dL   HCT 37.4 36.0 - 46.0 %   MCV 92.6 80.0 - 100.0 fL   MCH 31.2 26.0 - 34.0 pg   MCHC 33.7 30.0 - 36.0 g/dL   RDW 12.5 11.5 - 15.5 %   Platelets 187 150 - 400 K/uL   nRBC 0.0 0.0 - 0.2 %   Neutrophils Relative % 64 %   Neutro Abs 5.3 1.7 - 7.7 K/uL   Lymphocytes Relative 22 %   Lymphs Abs 1.8  0.7 - 4.0 K/uL   Monocytes  Relative 9 %   Monocytes Absolute 0.7 0.1 - 1.0 K/uL   Eosinophils Relative 4 %   Eosinophils Absolute 0.3 0.0 - 0.5 K/uL   Basophils Relative 1 %   Basophils Absolute 0.1 0.0 - 0.1 K/uL   Immature Granulocytes 0 %   Abs Immature Granulocytes 0.03 0.00 - 0.07 K/uL  Urinalysis, Routine w reflex microscopic  Result Value Ref Range   Color, Urine YELLOW YELLOW   APPearance CLEAR CLEAR   Specific Gravity, Urine 1.005 1.005 - 1.030   pH 7.0 5.0 - 8.0   Glucose, UA NEGATIVE NEGATIVE mg/dL   Hgb urine dipstick NEGATIVE NEGATIVE   Bilirubin Urine NEGATIVE NEGATIVE   Ketones, ur NEGATIVE NEGATIVE mg/dL   Protein, ur NEGATIVE NEGATIVE mg/dL   Nitrite NEGATIVE NEGATIVE   Leukocytes,Ua TRACE (A) NEGATIVE   RBC / HPF 0-5 0 - 5 RBC/hpf   WBC, UA 0-5 0 - 5 WBC/hpf   Bacteria, UA RARE (A) NONE SEEN   Squamous Epithelial / LPF 0-5 0 - 5   Dg Chest 2 View Result Date: 04/18/2019 CLINICAL DATA:  85 year old female with a history of diarrhea EXAM: CHEST - 2 VIEW COMPARISON:  February 08, 2014 FINDINGS: Cardiomediastinal silhouette unchanged in size and contour. Surgical changes of median sternotomy. No pneumothorax or pleural effusion. No confluent airspace disease. Coarsened interstitial markings, similar to prior. Degenerative changes of the spine. No displaced fracture. IMPRESSION: Chronic lung changes without evidence of acute cardiopulmonary disease. Median sternotomy Electronically Signed   By: Corrie Mckusick D.O.   On: 04/18/2019 09:59   Ct Abdomen Pelvis W Contrast Result Date: 04/18/2019 CLINICAL DATA:  Intermittent diarrhea for 2 months. EXAM: CT ABDOMEN AND PELVIS WITH CONTRAST TECHNIQUE: Multidetector CT imaging of the abdomen and pelvis was performed using the standard protocol following bolus administration of intravenous contrast. CONTRAST:  128mL OMNIPAQUE IOHEXOL 300 MG/ML  SOLN COMPARISON:  None. FINDINGS: Lower chest: No significant pulmonary nodules or acute consolidative airspace disease.  Coronary atherosclerosis. Hepatobiliary: Normal liver size. No liver mass. Cholecystectomy. Bile ducts are within normal post cholecystectomy limits. Pancreas: Normal, with no mass or duct dilation. Spleen: Normal size. No mass. Adrenals/Urinary Tract: Normal adrenals. No hydronephrosis. Scattered subcentimeter hypodense renal cortical lesions in both kidneys are too small to characterize and require no follow-up. Normal bladder. Stomach/Bowel: Normal non-distended stomach. Normal caliber small bowel with no small bowel wall thickening. Appendix not discretely visualized. No pericecal inflammatory changes. Minimal left colonic diverticulosis. Large bowel is collapsed. No convincing large bowel wall thickening or significant pericolonic fat stranding. Vascular/Lymphatic: Atherosclerotic nonaneurysmal abdominal aorta. Patent portal, splenic, hepatic and renal veins. No pathologically enlarged lymph nodes in the abdomen or pelvis. Reproductive: Grossly normal uterus.  No adnexal mass. Other: No pneumoperitoneum, ascites or focal fluid collection. Musculoskeletal: No aggressive appearing focal osseous lesions. Moderate to marked lumbar spondylosis. IMPRESSION: 1. No evidence of bowel obstruction or acute bowel inflammation. Mild left colonic diverticulosis, with no evidence of acute diverticulitis. 2. Coronary atherosclerosis. 3.  Aortic Atherosclerosis (ICD10-I70.0). Electronically Signed   By: Ilona Sorrel M.D.   On: 04/18/2019 10:16    Ariel Rodriguez was evaluated in Emergency Department on 04/18/2019 for the symptoms described in the history of present illness. She was evaluated in the context of the global COVID-19 pandemic, which necessitated consideration that the patient might be at risk for infection with the SARS-CoV-2 virus that causes COVID-19. Institutional protocols and algorithms that pertain to the  evaluation of patients at risk for COVID-19 are in a state of rapid change based on information  released by regulatory bodies including the CDC and federal and state organizations. These policies and algorithms were followed during the patient's care in the ED.    1140:  Pt has tol PO well while in the ED without N/V.  No stooling while in the ED.  Abd remains benign, resps easy, VSS. Workup reassuring. No clear indication for admission at this time. Pt will need to f/u with PMD and GI MD. Dx and testing, d/w pt and family.  Questions answered.  Verb understanding, agreeable to d/c home with outpt f/u.     Final Clinical Impressions(s) / ED Diagnoses   Final diagnoses:  None    ED Discharge Orders    None       Francine Graven, DO 04/23/19 1518

## 2019-04-19 LAB — URINE CULTURE

## 2019-04-21 ENCOUNTER — Ambulatory Visit: Payer: PPO | Admitting: Gastroenterology

## 2019-04-21 ENCOUNTER — Other Ambulatory Visit: Payer: Self-pay

## 2019-04-21 ENCOUNTER — Encounter: Payer: Self-pay | Admitting: Gastroenterology

## 2019-04-21 DIAGNOSIS — K529 Noninfective gastroenteritis and colitis, unspecified: Secondary | ICD-10-CM | POA: Diagnosis not present

## 2019-04-21 MED ORDER — ONDANSETRON HCL 4 MG PO TABS: 4.0000 mg | ORAL_TABLET | Freq: Three times a day (TID) | ORAL | 0 refills | Status: DC | PRN

## 2019-04-21 NOTE — Assessment & Plan Note (Addendum)
85 y/o female with recent acute onset nausea and diarrhea. Suspect initial flagyl contributed to nausea. Stool GI pathogen panel while in ED was positive for Enteropathogenic E. Coli. At this time her bowel function is significantly improved therefore would not advise antibiotics. Would continue supportive measures. Advised her to take zofran 4mg  every morning and 8 hours later to help with nausea and hopefully she will be able to increase her oral intake. Avoid roughage and dairy until feeling better. Stick to soft/bland foods. Call in 48 hours if no improvement. Continue adequate liquids to keep urine light yellow. Add probiotic daily for four weeks.

## 2019-04-21 NOTE — Patient Instructions (Signed)
You have had a bacterial gastroenteritis. The type of bacteria you had does not require antibiotics. People usually get rid of it on their own. Since your diarrhea is better, we will hold off on antibiotics for now.   For your nausea, please take Zofran twice daily (at least 8 hours apart) for the next several days until your nausea has resolved.   Take a probiotic for the next four weeks.   Try to eat and drink as tolerated to build up your strength. Avoid dairy products and fatty foods right now as this may make your diarrhea worse. Raw vegetables should be avoided until you are feeling better. Stick to bland low fat foods that do not make your stomach hurt or cause increased stools. If you eat something and you feel worse after it, then try to avoid for now.   Call Thursday if you are not feeling better.   Food Choices to Help Relieve Diarrhea, Adult When you have diarrhea, the foods you eat and your eating habits are very important. Choosing the right foods and drinks can help:  Relieve diarrhea.  Replace lost fluids and nutrients.  Prevent dehydration. What general guidelines should I follow?  Relieving diarrhea  Choose foods with less than 2 g or .07 oz. of fiber per serving.  Limit fats to less than 8 tsp (38 g or 1.34 oz.) a day.  Avoid the following: ? Foods and beverages sweetened with high-fructose corn syrup, honey, or sugar alcohols such as xylitol, sorbitol, and mannitol. ? Foods that contain a lot of fat or sugar. ? Fried, greasy, or spicy foods. ? High-fiber grains, breads, and cereals. ? Raw fruits and vegetables.  Eat foods that are rich in probiotics. These foods include dairy products such as yogurt and fermented milk products. They help increase healthy bacteria in the stomach and intestines (gastrointestinal tract, or GI tract).  If you have lactose intolerance, avoid dairy products. These may make your diarrhea worse.  Take medicine to help stop diarrhea  (antidiarrheal medicine) only as told by your health care provider. Replacing nutrients  Eat small meals or snacks every 3-4 hours.  Eat bland foods, such as white rice, toast, or baked potato, until your diarrhea starts to get better. Gradually reintroduce nutrient-rich foods as tolerated or as told by your health care provider. This includes: ? Well-cooked protein foods. ? Peeled, seeded, and soft-cooked fruits and vegetables. ? Low-fat dairy products.  Take vitamin and mineral supplements as told by your health care provider. Preventing dehydration  Start by sipping water or a special solution to prevent dehydration (oral rehydration solution, ORS). Urine that is clear or pale yellow means that you are getting enough fluid.  Try to drink at least 8-10 cups of fluid each day to help replace lost fluids.  You may add other liquids in addition to water, such as clear juice or decaffeinated sports drinks, as tolerated or as told by your health care provider.  Avoid drinks with caffeine, such as coffee, tea, or soft drinks.  Avoid alcohol. What foods are recommended?     The items listed may not be a complete list. Talk with your health care provider about what dietary choices are best for you. Grains White rice. White, [place], or pita breads (fresh or toasted), including plain rolls, buns, or bagels. White pasta. Saltine, soda, or graham crackers. Pretzels. Low-fiber cereal. Cooked cereals made with water (such as cornmeal, farina, or cream cereals). Plain muffins. Matzo. Melba toast. Zwieback. Vegetables  Potatoes (without the skin). Most well-cooked and canned vegetables without skins or seeds. Tender lettuce. Fruits Apple sauce. Fruits canned in juice. Cooked apricots, cherries, grapefruit, peaches, pears, or plums. Fresh bananas and cantaloupe. Meats and other protein foods Baked or boiled chicken. Eggs. Tofu. Fish. Seafood. Smooth nut butters. Ground or well-cooked tender beef,  ham, veal, lamb, pork, or poultry. Dairy Plain yogurt, kefir, and unsweetened liquid yogurt. Lactose-free milk, buttermilk, skim milk, or soy milk. Low-fat or nonfat hard cheese. Beverages Water. Low-calorie sports drinks. Fruit juices without pulp. Strained tomato and vegetable juices. Decaffeinated teas. Sugar-free beverages not sweetened with sugar alcohols. Oral rehydration solutions, if approved by your health care provider. Seasoning and other foods Bouillon, broth, or soups made from recommended foods. What foods are not recommended? The items listed may not be a complete list. Talk with your health care provider about what dietary choices are best for you. Grains Whole grain, whole wheat, bran, or rye breads, rolls, pastas, and crackers. Wild or brown rice. Whole grain or bran cereals. Barley. Oats and oatmeal. Corn tortillas or taco shells. Granola. Popcorn. Vegetables Raw vegetables. Fried vegetables. Cabbage, broccoli, Brussels sprouts, artichokes, baked beans, beet greens, corn, kale, legumes, peas, sweet potatoes, and yams. Potato skins. Cooked spinach and cabbage. Fruits Dried fruit, including raisins and dates. Raw fruits. Stewed or dried prunes. Canned fruits with syrup. Meat and other protein foods Fried or fatty meats. Deli meats. Chunky nut butters. Nuts and seeds. Beans and lentils. Berniece Salines. Hot dogs. Sausage. Dairy High-fat cheeses. Whole milk, chocolate milk, and beverages made with milk, such as milk shakes. Half-and-half. Cream. sour cream. Ice cream. Beverages Caffeinated beverages (such as coffee, tea, soda, or energy drinks). Alcoholic beverages. Fruit juices with pulp. Prune juice. Soft drinks sweetened with high-fructose corn syrup or sugar alcohols. High-calorie sports drinks. Fats and oils Butter. Cream sauces. Margarine. Salad oils. Plain salad dressings. Olives. Avocados. Mayonnaise. Sweets and desserts Sweet rolls, doughnuts, and sweet breads. Sugar-free  desserts sweetened with sugar alcohols such as xylitol and sorbitol. Seasoning and other foods Honey. Hot sauce. Chili powder. Gravy. Cream-based or milk-based soups. Pancakes and waffles. Summary  When you have diarrhea, the foods you eat and your eating habits are very important.  Make sure you get at least 8-10 cups of fluid each day, or enough to keep your urine clear or pale yellow.  Eat bland foods and gradually reintroduce healthy, nutrient-rich foods as tolerated, or as told by your health care provider.  Avoid high-fiber, fried, greasy, or spicy foods. This information is not intended to replace advice given to you by your health care provider. Make sure you discuss any questions you have with your health care provider. Document Released: 11/17/2003 Document Revised: 12/18/2018 Document Reviewed: 08/24/2016 Elsevier Patient Education  2020 [employer].

## 2019-04-21 NOTE — Progress Notes (Signed)
Primary Care Physician: Glenda Chroman, MD  Primary Gastroenterologist:  Garfield Cornea, MD   Chief Complaint  Patient presents with  . Nausea    no vomiting; went to ED 04/18/19  . Diarrhea    worse past couple weeks    HPI: Ariel Rodriguez is a 85 y.o. female here for urgent evaluation of nausea, diarrhea.  Last seen in January 2018 prior to colonoscopy for heme positive stool.  Single tubular adenoma removed, no future colonoscopies planned unless she develops new symptoms.  Seen in the ED on August 8 with 34-month history of intermittent diarrhea, nausea.  Patient had been prescribed an antibiotic by her PCP several days prior to ED evaluation.  While in the emergency department her sodium was 132, glucose 148, CBC normal, C. difficile quick scan negative.  GI pathogen panel positive for enteropathogenic E. Coli.  CT abdomen pelvis with contrast showed no evidence of bowel obstruction or acute bowel inflammation.  Mild left colonic diverticulosis without evidence of acute diverticulitis.  Patient states she has had two weeks of diarrhea, nausea. 3-4 runny stools dialy. Imodium may hold for 24 hours. Called PCP. NP called her and called in Flagyl for possible diverticulitis per patient. She started on clear liquid diet. Took one Flagyl Thursday and three on Friday. Didn't agree with her. Felt like was going to die. Saturday went to ED.   This morning more formed bowel movement with first stool. Then some bristol 5. stool frequency has improved significantly. No nocturnal diarrhea.  No abdominal pain. Still struggling with nausea. Only took zofran while on flagyl. Last imodium 3-4 days ago. No heartburn, vomiting. Not eating well.    Current Outpatient Medications  Medication Sig Dispense Refill  . ALPRAZolam (XANAX) 0.25 MG tablet Take 0.25 mg by mouth 2 (two) times daily as needed.    Marland Kitchen amLODipine (NORVASC) 2.5 MG tablet Take 1 tablet (2.5 mg total) by mouth daily. 90 tablet 3   . aspirin EC 81 MG tablet Take 81 mg by mouth daily.    Marland Kitchen atenolol (TENORMIN) 50 MG tablet Take 50 mg by mouth every evening.    Marland Kitchen atorvastatin (LIPITOR) 10 MG tablet Take 10 mg by mouth daily at 6 PM.     . chlorthalidone (HYGROTON) 25 MG tablet Take 1 tablet by mouth daily.    . cholecalciferol (VITAMIN D) 1000 UNITS tablet Take 1,000 Units by mouth daily.    . Cinnamon 500 MG capsule Take 500 mg by mouth every evening.    . isosorbide mononitrate (IMDUR) 30 MG 24 hr tablet Take 1 tablet (30 mg total) by mouth daily. 90 tablet 1  . levothyroxine (SYNTHROID, LEVOTHROID) 75 MCG tablet Take 75 mcg by mouth daily before breakfast.    . lisinopril (PRINIVIL,ZESTRIL) 20 MG tablet Take 20 mg by mouth 2 (two) times daily.    . Melatonin 3 MG TABS Take 3 mg by mouth at bedtime as needed (sleep).     . metFORMIN (GLUCOPHAGE) 500 MG tablet Take 500 mg by mouth 2 (two) times daily with a meal.     . metroNIDAZOLE (METROGEL) 0.75 % gel Apply 1 application topically daily as needed.    . Multiple Vitamin (MULTIVITAMIN WITH MINERALS) TABS tablet Take 1 tablet by mouth daily.    . nitroGLYCERIN (NITROSTAT) 0.4 MG SL tablet PLACE 1 TABLET UNDER THE TONGUE AT ONSET OF CHEST PAIN EVERY 5 MINTUES UP TO 3 TIMES AS NEEDED 25 tablet 3  .  Omega-3 Fatty Acids (KP OMEGA-3 FISH OIL) 1200 MG CAPS Take 1,200 mg by mouth daily.    . ondansetron (ZOFRAN) 4 MG tablet Take 1 tablet by mouth as needed.     . pantoprazole (PROTONIX) 40 MG tablet Take 40 mg by mouth daily.     . Polyvinyl Alcohol-Povidone (REFRESH OP) Place 1 drop into both eyes daily.    Marland Kitchen triamcinolone cream (KENALOG) 0.1 % Apply 1 application topically 2 (two) times daily.     No current facility-administered medications for this visit.     Allergies as of 04/21/2019 - Review Complete 04/21/2019  Allergen Reaction Noted  . Ciprofloxacin Diarrhea and Nausea And Vomiting 07/07/2013  . Codeine Other (See Comments) 10/03/2016  . Erythromycin Diarrhea and  Nausea And Vomiting 07/07/2013  . Prednisone Nausea And Vomiting 12/22/2014  . Sulfa antibiotics Nausea And Vomiting 10/03/2016    ROS:  General: Negative for anorexia, weight loss, fever, chills, fatigue, +weakness. ENT: Negative for hoarseness, difficulty swallowing , nasal congestion. CV: Negative for chest pain, angina, palpitations, dyspnea on exertion, peripheral edema.  Respiratory: Negative for dyspnea at rest, dyspnea on exertion, cough, sputum, wheezing.  GI: See history of present illness. GU:  Negative for dysuria, hematuria, urinary incontinence, urinary frequency, nocturnal urination.  Endo: Negative for unusual weight change.    Physical Examination:   BP (!) 165/76   Pulse 60   Temp (!) 96.9 F (36.1 C) (Temporal)   Ht 5' (1.524 m)   Wt 130 lb 6.4 oz (59.1 kg)   BMI 25.47 kg/m   General: Well-nourished, well-developed in no acute distress.  Eyes: No icterus. Abdomen: Bowel sounds are normal, mild diffuse lower abd tenderness, nondistended, no hepatosplenomegaly or masses, no abdominal bruits or hernia , no rebound or guarding.   Extremities: No lower extremity edema. No clubbing or deformities. Neuro: Alert and oriented x 4   Skin: Warm and dry, no jaundice.   Psych: Alert and cooperative, normal mood and affect.  Labs:  Lab Results  Component Value Date   CREATININE 0.87 04/18/2019   BUN 12 04/18/2019   NA 132 (L) 04/18/2019   K 4.0 04/18/2019   CL 96 (L) 04/18/2019   CO2 24 04/18/2019   Lab Results  Component Value Date   ALT 23 04/18/2019   AST 24 04/18/2019   ALKPHOS 44 04/18/2019   BILITOT 0.6 04/18/2019   Lab Results  Component Value Date   WBC 8.1 04/18/2019   HGB 12.6 04/18/2019   HCT 37.4 04/18/2019   MCV 92.6 04/18/2019   PLT 187 04/18/2019   Lab Results  Component Value Date   LIPASE 27 04/18/2019    Imaging Studies: Dg Chest 2 View  Result Date: 04/18/2019 CLINICAL DATA:  85 year old female with a history of diarrhea EXAM:  CHEST - 2 VIEW COMPARISON:  February 08, 2014 FINDINGS: Cardiomediastinal silhouette unchanged in size and contour. Surgical changes of median sternotomy. No pneumothorax or pleural effusion. No confluent airspace disease. Coarsened interstitial markings, similar to prior. Degenerative changes of the spine. No displaced fracture. IMPRESSION: Chronic lung changes without evidence of acute cardiopulmonary disease. Median sternotomy Electronically Signed   By: Corrie Mckusick D.O.   On: 04/18/2019 09:59   Ct Abdomen Pelvis W Contrast  Result Date: 04/18/2019 CLINICAL DATA:  Intermittent diarrhea for 2 months. EXAM: CT ABDOMEN AND PELVIS WITH CONTRAST TECHNIQUE: Multidetector CT imaging of the abdomen and pelvis was performed using the standard protocol following bolus administration of intravenous contrast. CONTRAST:  198mL OMNIPAQUE IOHEXOL 300 MG/ML  SOLN COMPARISON:  None. FINDINGS: Lower chest: No significant pulmonary nodules or acute consolidative airspace disease. Coronary atherosclerosis. Hepatobiliary: Normal liver size. No liver mass. Cholecystectomy. Bile ducts are within normal post cholecystectomy limits. Pancreas: Normal, with no mass or duct dilation. Spleen: Normal size. No mass. Adrenals/Urinary Tract: Normal adrenals. No hydronephrosis. Scattered subcentimeter hypodense renal cortical lesions in both kidneys are too small to characterize and require no follow-up. Normal bladder. Stomach/Bowel: Normal non-distended stomach. Normal caliber small bowel with no small bowel wall thickening. Appendix not discretely visualized. No pericecal inflammatory changes. Minimal left colonic diverticulosis. Large bowel is collapsed. No convincing large bowel wall thickening or significant pericolonic fat stranding. Vascular/Lymphatic: Atherosclerotic nonaneurysmal abdominal aorta. Patent portal, splenic, hepatic and renal veins. No pathologically enlarged lymph nodes in the abdomen or pelvis. Reproductive: Grossly normal  uterus.  No adnexal mass. Other: No pneumoperitoneum, ascites or focal fluid collection. Musculoskeletal: No aggressive appearing focal osseous lesions. Moderate to marked lumbar spondylosis. IMPRESSION: 1. No evidence of bowel obstruction or acute bowel inflammation. Mild left colonic diverticulosis, with no evidence of acute diverticulitis. 2. Coronary atherosclerosis. 3.  Aortic Atherosclerosis (ICD10-I70.0). Electronically Signed   By: Ilona Sorrel M.D.   On: 04/18/2019 10:16

## 2019-04-22 NOTE — Progress Notes (Signed)
CC'D TO PCP °

## 2019-04-30 ENCOUNTER — Other Ambulatory Visit: Payer: Self-pay | Admitting: Gastroenterology

## 2019-04-30 DIAGNOSIS — I2510 Atherosclerotic heart disease of native coronary artery without angina pectoris: Secondary | ICD-10-CM | POA: Diagnosis not present

## 2019-04-30 DIAGNOSIS — I10 Essential (primary) hypertension: Secondary | ICD-10-CM | POA: Diagnosis not present

## 2019-04-30 DIAGNOSIS — E119 Type 2 diabetes mellitus without complications: Secondary | ICD-10-CM | POA: Diagnosis not present

## 2019-05-04 ENCOUNTER — Telehealth: Payer: Self-pay | Admitting: *Deleted

## 2019-05-04 NOTE — Telephone Encounter (Signed)
Patient called. She saw LSL 04/21/2019. She is running out of her zofran and she is still experiencing a lot of nausea. She also stated that her 1st BM in the mornings are normal. Then about 1 hr later they are loose (not diarrhea though per pt), yellow in color and has white specs in the water as well that she is presuming is infection. She wants to also know how long should she expect this? Patient will be available tomorrow afternoon for a call back. Pleased advise LSL thanks

## 2019-05-05 MED ORDER — AZITHROMYCIN 500 MG PO TABS: 500.0000 mg | ORAL_TABLET | Freq: Every day | ORAL | 0 refills | Status: AC

## 2019-05-05 MED ORDER — ONDANSETRON HCL 4 MG PO TABS: 4.0000 mg | ORAL_TABLET | Freq: Three times a day (TID) | ORAL | 0 refills | Status: DC | PRN

## 2019-05-05 NOTE — Telephone Encounter (Addendum)
Sounds like she is seeing some mucous in the stool which is ok. She has positive stool for Enteropathogenic Ecoli two weeks ago. Still with a lot of nausea.   Make sure she is taking the probiotics.  Refill provided on her Zofran.  It may take several weeks to return to normal but if she would like to try antibiotics we can as she is slow to improve.   RX for Zofran and Azithromycin sent. (ASK HER IF SHE HAS EVER HAD THIS OR ZPAK AND TOLERATED). SHE LISTS ERYTHROMYCIN AS ALLERGY (CAUSED N/V/D) AND IT IS IN SAME CLASS WITH AZITHROMYCIN.

## 2019-05-05 NOTE — Telephone Encounter (Addendum)
While having loose stools, I would avoid dairy, raw veggies/salads, greasy foods.

## 2019-05-05 NOTE — Addendum Note (Signed)
Addended by: Mahala Menghini on: 05/05/2019 10:29 AM   Modules accepted: Orders

## 2019-05-05 NOTE — Telephone Encounter (Signed)
LMOVM

## 2019-05-05 NOTE — Telephone Encounter (Signed)
Patient called back and is aware of recs. She states she is not sure if she has taken azithromycin in the past. She states she will pick this up and hold on to it if she decides to take it. She wants to know what type of diet should she be on?

## 2019-05-06 NOTE — Telephone Encounter (Signed)
Patient aware.

## 2019-05-11 DIAGNOSIS — E1165 Type 2 diabetes mellitus with hyperglycemia: Secondary | ICD-10-CM | POA: Diagnosis not present

## 2019-05-25 DIAGNOSIS — Z299 Encounter for prophylactic measures, unspecified: Secondary | ICD-10-CM | POA: Diagnosis not present

## 2019-05-25 DIAGNOSIS — I10 Essential (primary) hypertension: Secondary | ICD-10-CM | POA: Diagnosis not present

## 2019-05-25 DIAGNOSIS — E039 Hypothyroidism, unspecified: Secondary | ICD-10-CM | POA: Diagnosis not present

## 2019-05-25 DIAGNOSIS — E1165 Type 2 diabetes mellitus with hyperglycemia: Secondary | ICD-10-CM | POA: Diagnosis not present

## 2019-05-25 DIAGNOSIS — K219 Gastro-esophageal reflux disease without esophagitis: Secondary | ICD-10-CM | POA: Diagnosis not present

## 2019-05-25 DIAGNOSIS — Z6828 Body mass index (BMI) 28.0-28.9, adult: Secondary | ICD-10-CM | POA: Diagnosis not present

## 2019-05-25 DIAGNOSIS — F419 Anxiety disorder, unspecified: Secondary | ICD-10-CM | POA: Diagnosis not present

## 2019-06-08 DIAGNOSIS — I10 Essential (primary) hypertension: Secondary | ICD-10-CM | POA: Diagnosis not present

## 2019-06-11 ENCOUNTER — Encounter (HOSPITAL_COMMUNITY): Payer: Self-pay | Admitting: Emergency Medicine

## 2019-06-11 ENCOUNTER — Emergency Department (HOSPITAL_COMMUNITY)
Admission: EM | Admit: 2019-06-11 | Discharge: 2019-06-11 | Disposition: A | Discharge status: Home / Self Care | Payer: PPO | Attending: Emergency Medicine | Admitting: Emergency Medicine

## 2019-06-11 ENCOUNTER — Emergency Department (HOSPITAL_COMMUNITY): Payer: PPO

## 2019-06-11 ENCOUNTER — Other Ambulatory Visit: Payer: Self-pay

## 2019-06-11 DIAGNOSIS — R42 Dizziness and giddiness: Secondary | ICD-10-CM | POA: Insufficient documentation

## 2019-06-11 DIAGNOSIS — R4182 Altered mental status, unspecified: Secondary | ICD-10-CM | POA: Diagnosis not present

## 2019-06-11 DIAGNOSIS — Z951 Presence of aortocoronary bypass graft: Secondary | ICD-10-CM | POA: Diagnosis not present

## 2019-06-11 DIAGNOSIS — R0689 Other abnormalities of breathing: Secondary | ICD-10-CM | POA: Diagnosis not present

## 2019-06-11 DIAGNOSIS — R001 Bradycardia, unspecified: Secondary | ICD-10-CM | POA: Diagnosis not present

## 2019-06-11 DIAGNOSIS — I2510 Atherosclerotic heart disease of native coronary artery without angina pectoris: Secondary | ICD-10-CM | POA: Insufficient documentation

## 2019-06-11 DIAGNOSIS — I10 Essential (primary) hypertension: Secondary | ICD-10-CM | POA: Insufficient documentation

## 2019-06-11 DIAGNOSIS — Z79899 Other long term (current) drug therapy: Secondary | ICD-10-CM | POA: Diagnosis not present

## 2019-06-11 DIAGNOSIS — Z7982 Long term (current) use of aspirin: Secondary | ICD-10-CM | POA: Diagnosis not present

## 2019-06-11 DIAGNOSIS — R404 Transient alteration of awareness: Secondary | ICD-10-CM | POA: Diagnosis not present

## 2019-06-11 DIAGNOSIS — Z7984 Long term (current) use of oral hypoglycemic drugs: Secondary | ICD-10-CM | POA: Diagnosis not present

## 2019-06-11 DIAGNOSIS — R9431 Abnormal electrocardiogram [ECG] [EKG]: Secondary | ICD-10-CM | POA: Diagnosis not present

## 2019-06-11 DIAGNOSIS — E119 Type 2 diabetes mellitus without complications: Secondary | ICD-10-CM | POA: Diagnosis not present

## 2019-06-11 DIAGNOSIS — I213 ST elevation (STEMI) myocardial infarction of unspecified site: Secondary | ICD-10-CM | POA: Diagnosis not present

## 2019-06-11 LAB — BASIC METABOLIC PANEL
Anion gap: 10 (ref 5–15)
BUN: 15 mg/dL (ref 8–23)
CO2: 26 mmol/L (ref 22–32)
Calcium: 9.4 mg/dL (ref 8.9–10.3)
Chloride: 102 mmol/L (ref 98–111)
Creatinine, Ser: 0.95 mg/dL (ref 0.44–1.00)
GFR calc Af Amer: >60 mL/min (ref >60)
GFR calc non Af Amer: 54 mL/min — ABNORMAL LOW (ref >60)
Glucose, Bld: 115 mg/dL — ABNORMAL HIGH (ref 70–99)
Potassium: 4.2 mmol/L (ref 3.5–5.1)
Sodium: 138 mmol/L (ref 135–145)

## 2019-06-11 LAB — CBC
HCT: 38.2 % (ref 36.0–46.0)
Hemoglobin: 12.6 g/dL (ref 12.0–15.0)
MCH: 31.9 pg (ref 26.0–34.0)
MCHC: 33 g/dL (ref 30.0–36.0)
MCV: 96.7 fL (ref 80.0–100.0)
Platelets: 196 10*3/uL (ref 150–400)
RBC: 3.95 MIL/uL (ref 3.87–5.11)
RDW: 12.3 % (ref 11.5–15.5)
WBC: 10.7 10*3/uL — ABNORMAL HIGH (ref 4.0–10.5)
nRBC: 0 % (ref 0.0–0.2)

## 2019-06-11 LAB — CBG MONITORING, ED: Glucose-Capillary: 110 mg/dL — ABNORMAL HIGH (ref 70–99)

## 2019-06-11 MED ORDER — SODIUM CHLORIDE 0.9 % IV BOLUS
1000.0000 mL | Freq: Once | INTRAVENOUS | Status: AC
  Administered 2019-06-11: 14:00:00 1000 mL via INTRAVENOUS

## 2019-06-11 MED ORDER — SODIUM CHLORIDE 0.9% FLUSH: 3.0000 mL | Freq: Once | INTRAVENOUS | Status: DC

## 2019-06-11 NOTE — ED Triage Notes (Signed)
To ED via [place] from home-- EMS was called called because pt was dizzy-- had an episode of unresponsiveness, husband was able to wake her up, went inside, but remained dizzy. No slurred speech, moves all extremities, denies any chest pain-

## 2019-06-11 NOTE — Discharge Instructions (Addendum)
Continue medications as previously prescribed.  Return to the emergency department if your symptoms significantly worsen or change. 

## 2019-06-11 NOTE — ED Provider Notes (Signed)
[facility] EMERGENCY DEPARTMENT Provider Note   CSN: [id number] Arrival date & time: 06/11/19  1300     History   Chief Complaint Chief Complaint  Patient presents with  . Dizziness    HPI Ariel Rodriguez is a 85 y.o. female.     Patient is an 85 year old female with history of coronary artery disease with bypass surgery 20 years ago.  She also has history of diabetes and hypertension.  She presents today for evaluation of transient altered level of consciousness.  Patient states she woke this morning in her normal state of health, then worked on the front porch applying a sealant with a roller brush.  This took approximately 90 minutes to complete.  Afterward she was sitting on the sofa when her husband noted she was not responding and her head was slumped over.  Shortly afterward, the patient's husband was able to wake her up.  Patient does describe some dizziness, but otherwise has no headache or other complaints.  She was transported here by EMS where she remained hemodynamically stable.  The history is provided by the patient.  Dizziness Quality:  Lightheadedness Severity:  Moderate Onset quality:  Sudden Timing:  Constant Progression:  Improving Chronicity:  New Relieved by:  Nothing Worsened by:  Nothing Ineffective treatments:  None tried   Past Medical History:  Diagnosis Date  . Coronary artery disease    LHC (02/09/14):  Mid LAD occluded, patent circumflex, mid RCA sequential 90%, LIMA-LAD patent radial Y graft to distal RCA patent, EF 60%  . Diabetes mellitus without complication ([place])   . Heart attack ([place]) 1999  . Hypertension   . PONV (postoperative nausea and vomiting)     Patient Active Problem List   Diagnosis Date Noted  . Acute gastroenteritis 04/21/2019  . Syncope 05/07/2018  . Dyslipidemia 03/26/2018  . Heme + stool 10/01/2016  . PONV (postoperative nausea and vomiting) 10/01/2016  . Nausea with vomiting 06/19/2016  . Old  MI (myocardial infarction) 02/09/2014  . Coronary atherosclerosis of native coronary artery 02/09/2014  . Hx of CABG 02/09/2014  . Type II or unspecified type diabetes mellitus without mention of complication, not stated as uncontrolled 02/09/2014  . Pure hypercholesterolemia 02/09/2014  . Essential hypertension, benign 02/09/2014  . Unstable angina pectoris ([place]) 02/08/2014    Past Surgical History:  Procedure Laterality Date  . CARDIAC SURGERY    . COLONOSCOPY N/A 10/10/2016   Dr. Gala Romney: single tubular adenoma removed. no future colonoscopies unless develops new symptoms.   Marland Kitchen LAPAROSCOPIC CHOLECYSTECTOMY N/A 05/04/2016  . LEFT HEART CATHETERIZATION WITH CORONARY/GRAFT ANGIOGRAM N/A 02/09/2014   Procedure: LEFT HEART CATHETERIZATION WITH Beatrix Fetters;  Surgeon: Jettie Booze, MD;  Location: [facility] CATH LAB;  Service: Cardiovascular;  Laterality: N/A;  . POLYPECTOMY  10/10/2016   Procedure: POLYPECTOMY;  Surgeon: Daneil Dolin, MD;  Location: [facility] ENDO SUITE;  Service: Endoscopy;;  colon     OB History   No obstetric history on file.      Home Medications    Prior to Admission medications   Medication Sig Start Date End Date Taking? Authorizing Provider  ALPRAZolam (XANAX) 0.25 MG tablet Take 0.25 mg by mouth 2 (two) times daily as needed.    [provider]  amLODipine (NORVASC) 2.5 MG tablet Take 1 tablet (2.5 mg total) by mouth daily. 05/07/18   Minus Breeding, MD  aspirin EC 81 MG tablet Take 81 mg by mouth daily.    [provider]  atenolol (TENORMIN) 50 MG tablet Take 50 mg by mouth every evening.    [provider]  atorvastatin (LIPITOR) 10 MG tablet Take 10 mg by mouth daily at 6 PM.     [provider]  chlorthalidone (HYGROTON) 25 MG tablet Take 1 tablet by mouth daily.    [provider]  cholecalciferol (VITAMIN D) 1000 UNITS tablet Take 1,000 Units by mouth daily.    [provider]  Cinnamon 500 MG  capsule Take 500 mg by mouth every evening.    [provider]  isosorbide mononitrate (IMDUR) 30 MG 24 hr tablet Take 1 tablet (30 mg total) by mouth daily. 05/26/15   Minus Breeding, MD  levothyroxine (SYNTHROID, LEVOTHROID) 75 MCG tablet Take 75 mcg by mouth daily before breakfast.    [provider]  lisinopril (PRINIVIL,ZESTRIL) 20 MG tablet Take 20 mg by mouth 2 (two) times daily.    [provider]  Melatonin 3 MG TABS Take 3 mg by mouth at bedtime as needed (sleep).     [provider]  metFORMIN (GLUCOPHAGE) 500 MG tablet Take 500 mg by mouth 2 (two) times daily with a meal.     [provider]  metroNIDAZOLE (METROGEL) 0.75 % gel Apply 1 application topically daily as needed. 12/11/18   [provider]  Multiple Vitamin (MULTIVITAMIN WITH MINERALS) TABS tablet Take 1 tablet by mouth daily.    [provider]  nitroGLYCERIN (NITROSTAT) 0.4 MG SL tablet PLACE 1 TABLET UNDER THE TONGUE AT ONSET OF CHEST PAIN EVERY 5 MINTUES UP TO 3 TIMES AS NEEDED 07/11/18   Minus Breeding, MD  Omega-3 Fatty Acids (KP OMEGA-3 FISH OIL) 1200 MG CAPS Take 1,200 mg by mouth daily.    [provider]  ondansetron (ZOFRAN) 4 MG tablet Take 1 tablet (4 mg total) by mouth every 8 (eight) hours as needed for nausea. 05/05/19   Mahala Menghini, PA-C  pantoprazole (PROTONIX) 40 MG tablet Take 40 mg by mouth daily.     [provider]  Polyvinyl Alcohol-Povidone (REFRESH OP) Place 1 drop into both eyes daily.    [provider]  triamcinolone cream (KENALOG) 0.1 % Apply 1 application topically 2 (two) times daily. 04/08/19   [provider]    Family History No family history on file.  Social History Social History   Tobacco Use  . Smoking status: Never Smoker  . Smokeless tobacco: Never Used  Substance Use Topics  . Alcohol use: No  . Drug use: No     Allergies   Ciprofloxacin, Codeine, Erythromycin,  Prednisone, and Sulfa antibiotics   Review of Systems Review of Systems  Neurological: Positive for dizziness.  All other systems reviewed and are negative.    Physical Exam Updated Vital Signs BP (!) 169/68 (BP Location: Right Arm)   Pulse (!) 56   Temp (!) 97.2 F (36.2 C) (Oral)   Resp 18   Ht 5' (1.524 m)   Wt 59 kg   SpO2 95%   BMI 25.39 kg/m   Physical Exam Vitals signs and nursing note reviewed.  Constitutional:      General: She is not in acute distress.    Appearance: She is well-developed. She is not diaphoretic.  HENT:     Head: Normocephalic and atraumatic.  Eyes:     Extraocular Movements: Extraocular movements intact.     Pupils: Pupils are equal, round, and reactive to light.  Neck:     Musculoskeletal:  Normal range of motion and neck supple.  Cardiovascular:     Rate and Rhythm: Normal rate and regular rhythm.     Heart sounds: No murmur. No friction rub. No gallop.   Pulmonary:     Effort: Pulmonary effort is normal. No respiratory distress.     Breath sounds: Normal breath sounds. No wheezing.  Abdominal:     General: Bowel sounds are normal. There is no distension.     Palpations: Abdomen is soft.     Tenderness: There is no abdominal tenderness.  Musculoskeletal: Normal range of motion.  Skin:    General: Skin is warm and dry.  Neurological:     General: No focal deficit present.     Mental Status: She is alert and oriented to person, place, and time.     Cranial Nerves: No cranial nerve deficit.     Motor: No weakness.     Coordination: Coordination normal.      ED Treatments / Results  Labs (all labs ordered are listed, but only abnormal results are displayed) Labs Reviewed  CBC - Abnormal; Notable for the following components:      Result Value   WBC 10.7 (*)    All other components within normal limits  BASIC METABOLIC PANEL  URINALYSIS, ROUTINE W REFLEX MICROSCOPIC  CBG MONITORING, ED    EKG EKG Interpretation   Date/Time:  Thursday June 11 2019 13:02:16 EDT Ventricular Rate:  58 PR Interval:    QRS Duration: 88 QT Interval:  397 QTC Calculation: 390 R Axis:   -52 Text Interpretation:  Sinus rhythm Abnormal R-wave progression, early transition Inferior infarct, old Consider anterior infarct Lateral leads are also involved Confirmed by Veryl Speak [phone]) on 06/11/2019 1:10:30 PM   Radiology No results found.  Procedures Procedures (including critical care time)  Medications Ordered in ED Medications  sodium chloride flush (NS) 0.9 % injection 3 mL (has no administration in time range)  sodium chloride 0.9 % bolus 1,000 mL (has no administration in time range)     Initial Impression / Assessment and Plan / ED Course  I have reviewed the triage vital signs and the nursing notes.  Pertinent labs & imaging results that were available during my care of the patient were reviewed by me and considered in my medical decision making (see chart for details).  Patient brought here for evaluation of transient altered level of consciousness.  According to the patient and family, she became unresponsive for several minutes after applying a sealant to her front porch.  She was apparently exerting herself for approximately 90 minutes prior to this episode.  Patient has been hemodynamically stable throughout EMS transport and throughout her ER course.  Her laboratory studies showed no significant abnormalities and vital signs have been stable.  Her neurologic exam is nonfocal and CT scan of the head is normal.  At this point, patient was ambulated successfully in her exam room.  She is requesting to go home and I feel as though that this is a reasonable disposition.  Patient's husband and son present at bedside and are comfortable with the disposition.  She understands to return if her symptoms worsen or change.  Final Clinical Impressions(s) / ED Diagnoses   Final diagnoses:  None    ED Discharge  Orders    None       Veryl Speak, MD 06/11/19 1510

## 2019-06-23 NOTE — Progress Notes (Addendum)
HPI The patient presents for followup of known coronary disease with CABG in 1999. She was in the hospital in June 2015. She had chest pain ruled out for myocardial infarction. She had patent bypass grafts on cath.  Since I last saw her she was in the ED earlier this month with LOC.  I reviewed these records for this visit.    She says she has been weak.  She would like to get back on her Norvasc 5 mg as it was increased by her primary provider.  However, her blood pressures actually been creeping up recently and I reviewed her blood pressure diary.  She is not describing orthostatic symptoms.  She just has generalized weakness.  She does have to take something to help her sleep but she sleeps okay.  Is not anemic and her thyroid.  I reviewed labs.  She is not having any chest pressure, neck or arm discomfort.  Denies any palpitations.  She has no new shortness of breath, PND or orthopnea.    Allergies  Allergen Reactions  . Ciprofloxacin Diarrhea and Nausea And Vomiting  . Codeine Other (See Comments)    Pt went to hospital for flu and states that kept getting worse with med.   . Erythromycin Diarrhea and Nausea And Vomiting  . Prednisone Nausea And Vomiting  . Sulfa Antibiotics Nausea And Vomiting    Current Outpatient Medications  Medication Sig Dispense Refill  . ALPRAZolam (XANAX) 0.25 MG tablet Take 0.25 mg by mouth 2 (two) times daily as needed for anxiety.     Marland Kitchen amLODipine (NORVASC) 5 MG tablet Take 5 mg by mouth at bedtime.    Marland Kitchen aspirin EC 81 MG tablet Take 81 mg by mouth daily.    Marland Kitchen atenolol (TENORMIN) 50 MG tablet Take 50 mg by mouth every evening.    Marland Kitchen atorvastatin (LIPITOR) 10 MG tablet Take 10 mg by mouth daily at 6 PM.     . cholecalciferol (VITAMIN D) 1000 UNITS tablet Take 1,000 Units by mouth daily.    . Cinnamon 500 MG capsule Take 500 mg by mouth every evening.    . isosorbide mononitrate (IMDUR) 30 MG 24 hr tablet Take 1 tablet (30 mg total) by mouth daily. 90  tablet 1  . levothyroxine (SYNTHROID, LEVOTHROID) 75 MCG tablet Take 75 mcg by mouth daily before breakfast.    . lisinopril (PRINIVIL,ZESTRIL) 20 MG tablet Take 20 mg by mouth 2 (two) times daily.    . metFORMIN (GLUCOPHAGE) 500 MG tablet Take 500 mg by mouth 2 (two) times daily with a meal.     . Multiple Vitamin (MULTIVITAMIN WITH MINERALS) TABS tablet Take 1 tablet by mouth daily.    . nitroGLYCERIN (NITROSTAT) 0.4 MG SL tablet PLACE 1 TABLET UNDER THE TONGUE AT ONSET OF CHEST PAIN EVERY 5 MINTUES UP TO 3 TIMES AS NEEDED (Patient taking differently: Place 0.4 mg under the tongue every 5 (five) minutes as needed for chest pain. ) 25 tablet 3  . Omega-3 Fatty Acids (KP OMEGA-3 FISH OIL) 1200 MG CAPS Take 1,200 mg by mouth daily.    . ondansetron (ZOFRAN) 4 MG tablet Take 1 tablet (4 mg total) by mouth every 8 (eight) hours as needed for nausea. 20 tablet 0  . pantoprazole (PROTONIX) 40 MG tablet Take 40 mg by mouth daily.     . Probiotic Product (PROBIOTIC DAILY PO) Take 1 capsule by mouth at bedtime.     No current facility-administered medications for  this visit.     Past Medical History:  Diagnosis Date  . Coronary artery disease    LHC (02/09/14):  Mid LAD occluded, patent circumflex, mid RCA sequential 90%, LIMA-LAD patent radial Y graft to distal RCA patent, EF 60%  . Diabetes mellitus without complication ([place])   . Heart attack ([place]) 1999  . Hypertension   . PONV (postoperative nausea and vomiting)     Past Surgical History:  Procedure Laterality Date  . CARDIAC SURGERY    . COLONOSCOPY N/A 10/10/2016   Dr. Gala Romney: single tubular adenoma removed. no future colonoscopies unless develops new symptoms.   Marland Kitchen LAPAROSCOPIC CHOLECYSTECTOMY N/A 05/04/2016  . LEFT HEART CATHETERIZATION WITH CORONARY/GRAFT ANGIOGRAM N/A 02/09/2014   Procedure: LEFT HEART CATHETERIZATION WITH Beatrix Fetters;  Surgeon: Jettie Booze, MD;  Location: [facility] CATH LAB;  Service: Cardiovascular;   Laterality: N/A;  . POLYPECTOMY  10/10/2016   Procedure: POLYPECTOMY;  Surgeon: Daneil Dolin, MD;  Location: [facility];  Service: Endoscopy;;  colon    ROS:   As stated in the HPI and negative for all other systems.  PHYSICAL EXAM BP 140/70   Pulse 64   Ht 5' (1.524 m)   Wt 129 lb (58.5 kg)   BMI 25.19 kg/m   GENERAL:  Well appearing NECK:  No jugular venous distention, waveform within normal limits, carotid upstroke brisk and symmetric, no bruits, no thyromegaly LUNGS:  Clear to auscultation bilaterally CHEST:  Well healed sternotomy scar. HEART:  PMI not displaced or sustained,S1 and S2 within normal limits, no S3, no S4, no clicks, no rubs, no murmurs ABD:  Flat, positive bowel sounds normal in frequency in pitch, no bruits, no rebound, no guarding, no midline pulsatile mass, no hepatomegaly, no splenomegaly EXT:  2 plus pulses throughout, no edema, no cyanosis no clubbing   EKG: 06/11/2019 sinus rhythm, left axis deviation, possible old inferior infarct, early transition lead V2 which is lead placement and different than previous.  T wave inversion in the lateral leads unchanged from previous.  ASSESSMENT AND PLAN  CAD s/p CABG:   The patient has no new sypmtoms.  No further cardiovascular testing is indicated.  We will continue with aggressive risk reduction and meds as listed.  HTN:    Her blood pressure is controlled and I would not want to go down on her amlodipine.  She and I discussed this.  I do not think that this is the etiology of her weakness.   SYNCOPE:   This occurred while she was roller painting some sealant on her back.  She probably had some fumes and some heat.  She is otherwise not had any episodes.  No further work-up is suggested.

## 2019-06-24 ENCOUNTER — Ambulatory Visit: Payer: PPO | Admitting: Cardiology

## 2019-06-24 ENCOUNTER — Other Ambulatory Visit: Payer: Self-pay

## 2019-06-24 ENCOUNTER — Encounter: Payer: Self-pay | Admitting: Cardiology

## 2019-06-24 VITALS — BP 140/70 | HR 64 | Ht 60.0 in | Wt 129.0 lb

## 2019-06-24 DIAGNOSIS — I2510 Atherosclerotic heart disease of native coronary artery without angina pectoris: Secondary | ICD-10-CM

## 2019-06-24 DIAGNOSIS — R55 Syncope and collapse: Secondary | ICD-10-CM

## 2019-06-24 DIAGNOSIS — I10 Essential (primary) hypertension: Secondary | ICD-10-CM

## 2019-06-24 NOTE — Patient Instructions (Signed)
Medication Instructions:  The current medical regimen is effective;  continue present plan and medications.  If you need a refill on your cardiac medications before your next appointment, please call your pharmacy.   Follow-Up: Follow up in 6 months with Dr. Hochrein.  You will receive a letter in the mail 2 months before you are due.  Please call us when you receive this letter to schedule your follow up appointment.  Thank you for choosing [facility]!!      

## 2019-07-02 ENCOUNTER — Telehealth: Payer: Self-pay | Admitting: *Deleted

## 2019-07-02 ENCOUNTER — Other Ambulatory Visit: Payer: Self-pay | Admitting: Gastroenterology

## 2019-07-02 NOTE — Telephone Encounter (Signed)
REFILL DONE.  PLEASE SCHEDULE OV FOR FOLLOW UP WITH RMR.

## 2019-07-02 NOTE — Telephone Encounter (Signed)
FYI to Leslie Lewis, PA.  

## 2019-07-02 NOTE — Telephone Encounter (Signed)
Pt has been taking nausea medicine (Zofran) to calm her stomach pains down.  She is out of medicine but will have [place] to send [place] a refill.  [phone]

## 2019-07-06 NOTE — Telephone Encounter (Signed)
Pt is aware and has picked up the [facility]. OK to schedule appointment with Dr. Gala Romney.  Forwarding to [place] to schedule.

## 2019-07-07 ENCOUNTER — Encounter: Payer: Self-pay | Admitting: Internal Medicine

## 2019-07-07 NOTE — Telephone Encounter (Signed)
PATIENT SCHEDULED AND LETTER SENT  °

## 2019-07-08 DIAGNOSIS — I10 Essential (primary) hypertension: Secondary | ICD-10-CM | POA: Diagnosis not present

## 2019-07-28 ENCOUNTER — Encounter: Payer: Self-pay | Admitting: Internal Medicine

## 2019-07-28 ENCOUNTER — Other Ambulatory Visit: Payer: Self-pay

## 2019-07-28 ENCOUNTER — Ambulatory Visit: Payer: PPO | Admitting: Internal Medicine

## 2019-07-28 VITALS — BP 152/69 | HR 67 | Temp 96.6°F | Ht 60.0 in | Wt 132.4 lb

## 2019-07-28 DIAGNOSIS — K529 Noninfective gastroenteritis and colitis, unspecified: Secondary | ICD-10-CM | POA: Diagnosis not present

## 2019-07-28 DIAGNOSIS — K219 Gastro-esophageal reflux disease without esophagitis: Secondary | ICD-10-CM

## 2019-07-28 NOTE — Patient Instructions (Signed)
Continue Protonix 40 mg daily  May continue Probiotic until the first of the year (or longer if you like taking it)  No follow-up appointment scheduled at this time.  Call if you have any future problems

## 2019-07-28 NOTE — Progress Notes (Signed)
Primary Care Physician:  Glenda Chroman, MD Primary Gastroenterologist:  Dr. Gala Romney  Pre-Procedure History & Physical: HPI:  Ariel Rodriguez is a 85 y.o. female here for follow-up after protracted bout of nausea and nonbloody diarrhea earlier in the year.  She had significant nausea but never vomited.  GIP was positive for enteropathogenic E. coli.  She recovered by the time she was seen in follow-up here.  She did not need specific antibiotics.  Fortunately, her GI symptoms resolved.  Is now asymptomatic from a GI standpoint.  She continues to take a probiotic. She does have GERD for which she takes Protonix 40 mg daily.  Past Medical History:  Diagnosis Date  . Coronary artery disease    LHC (02/09/14):  Mid LAD occluded, patent circumflex, mid RCA sequential 90%, LIMA-LAD patent radial Y graft to distal RCA patent, EF 60%  . Diabetes mellitus without complication ([place])   . Heart attack ([place]) 1999  . Hypertension   . PONV (postoperative nausea and vomiting)     Past Surgical History:  Procedure Laterality Date  . CARDIAC SURGERY    . COLONOSCOPY N/A 10/10/2016   Dr. Gala Romney: single tubular adenoma removed. no future colonoscopies unless develops new symptoms.   Marland Kitchen LAPAROSCOPIC CHOLECYSTECTOMY N/A 05/04/2016  . LEFT HEART CATHETERIZATION WITH CORONARY/GRAFT ANGIOGRAM N/A 02/09/2014   Procedure: LEFT HEART CATHETERIZATION WITH Beatrix Fetters;  Surgeon: Jettie Booze, MD;  Location: [facility] CATH LAB;  Service: Cardiovascular;  Laterality: N/A;  . POLYPECTOMY  10/10/2016   Procedure: POLYPECTOMY;  Surgeon: Daneil Dolin, MD;  [facility];  Service: Endoscopy;;  colon    Prior to Admission medications   Medication Sig Start Date End Date Taking? Authorizing Provider  ALPRAZolam (XANAX) 0.25 MG tablet Take 0.25 mg by mouth 2 (two) times daily as needed for anxiety.    Yes [provider]  amLODipine (NORVASC) 5 MG tablet Take 5 mg by mouth at bedtime.   Yes  [provider]  aspirin EC 81 MG tablet Take 81 mg by mouth daily.   Yes [provider]  atenolol (TENORMIN) 50 MG tablet Take 50 mg by mouth every evening.   Yes [provider]  atorvastatin (LIPITOR) 10 MG tablet Take 10 mg by mouth daily at 6 PM.    Yes [provider]  cholecalciferol (VITAMIN D) 1000 UNITS tablet Take 1,000 Units by mouth daily.   Yes [provider]  Cinnamon 500 MG capsule Take 500 mg by mouth every evening.   Yes [provider]  isosorbide mononitrate (IMDUR) 30 MG 24 hr tablet Take 1 tablet (30 mg total) by mouth daily. 05/26/15  Yes Minus Breeding, MD  levothyroxine (SYNTHROID, LEVOTHROID) 75 MCG tablet Take 75 mcg by mouth daily before breakfast.   Yes [provider]  lisinopril (PRINIVIL,ZESTRIL) 20 MG tablet Take 20 mg by mouth 2 (two) times daily.   Yes [provider]  metFORMIN (GLUCOPHAGE) 500 MG tablet Take 500 mg by mouth 2 (two) times daily with a meal.    Yes [provider]  Multiple Vitamin (MULTIVITAMIN WITH MINERALS) TABS tablet Take 1 tablet by mouth daily.   Yes [provider]  nitroGLYCERIN (NITROSTAT) 0.4 MG SL tablet PLACE 1 TABLET UNDER THE TONGUE AT ONSET OF CHEST PAIN EVERY 5 MINTUES UP TO 3 TIMES AS NEEDED Patient taking differently: Place 0.4 mg under the tongue every 5 (five) minutes as needed for chest pain.  07/11/18  Yes Minus Breeding, MD  Omega-3 Fatty Acids (KP OMEGA-3 FISH OIL) 1200 MG CAPS Take 1,200 mg by mouth daily.   Yes [provider]  ondansetron (ZOFRAN) 4 MG tablet Take 1 tablet (4 mg total) by mouth every 8 (eight) hours as needed for nausea. 07/02/19  Yes Mahala Menghini, PA-C  pantoprazole (PROTONIX) 40 MG tablet Take 40 mg by mouth daily.    Yes [provider]  Probiotic Product (PROBIOTIC DAILY PO) Take 1 capsule by mouth at bedtime.   Yes [provider]    Allergies as of 07/28/2019 - Review Complete  07/28/2019  Allergen Reaction Noted  . Ciprofloxacin Diarrhea and Nausea And Vomiting 07/07/2013  . Codeine Other (See Comments) 10/03/2016  . Erythromycin Diarrhea and Nausea And Vomiting 07/07/2013  . Prednisone Nausea And Vomiting 12/22/2014  . Sulfa antibiotics Nausea And Vomiting 10/03/2016    No family history on file.  Social History   Socioeconomic History  . Marital status: Married    Spouse name: Not on file  . Number of children: Not on file  . Years of education: Not on file  . Highest education level: Not on file  Occupational History  . Not on file  Social Needs  . Financial resource strain: Not on file  . Food insecurity    Worry: Not on file    Inability: Not on file  . Transportation needs    Medical: Not on file    Non-medical: Not on file  Tobacco Use  . Smoking status: Never Smoker  . Smokeless tobacco: Never Used  Substance and Sexual Activity  . Alcohol use: No  . Drug use: No  . Sexual activity: Not on file  Lifestyle  . Physical activity    Days per week: Not on file    Minutes per session: Not on file  . Stress: Not on file  Relationships  . Social Herbalist on phone: Not on file    Gets together: Not on file    Attends religious service: Not on file    Active member of club or organization: Not on file    Attends meetings of clubs or organizations: Not on file    Relationship status: Not on file  . Intimate partner violence    Fear of current or ex partner: Not on file    Emotionally abused: Not on file    Physically abused: Not on file    Forced sexual activity: Not on file  Other Topics Concern  . Not on file  Social History Narrative  . Not on file    Review of Systems: See HPI, otherwise negative ROS  Physical Exam: BP (!) 152/69   Pulse 67   Temp (!) 96.6 F (35.9 C) (Temporal)   Ht 5' (1.524 m)   Wt 132 lb 6.4 oz (60.1 kg)   BMI 25.86 kg/m  General:   Alert,  Well-developed, well-nourished, pleasant and  cooperative in NAD Lungs:  Clear throughout to auscultation.   No wheezes, crackles, or rhonchi. No acute distress. Heart:  Regular rate and rhythm; no murmurs, clicks, rubs,  or gallops. Abdomen: Non-distended, normal bowel sounds.  Soft and nontender without appreciable mass or hepatosplenomegaly.  Pulses:  Normal pulses noted. Extremities:  Without clubbing or edema.  Impression/Plan: Pleasant 85 year old lady with protracted course of enteric infection secondary to enteropathogenic E. Coli.  Clinically, she has fully recovered. GERD symptoms well controlled on Protonix 40 mg daily.  Recommendations:  Continue Protonix 40 mg daily  May continue Probiotic until the first of the year (or longer if you she desires)  No follow-up appointment scheduled at this time.  Patient to call any future problems   Notice: This dictation was prepared with Dragon dictation along with smaller phrase technology. Any transcriptional errors that result from this process are unintentional and may not be corrected upon review.

## 2019-08-07 DIAGNOSIS — I10 Essential (primary) hypertension: Secondary | ICD-10-CM | POA: Diagnosis not present

## 2019-08-28 DIAGNOSIS — E1165 Type 2 diabetes mellitus with hyperglycemia: Secondary | ICD-10-CM | POA: Diagnosis not present

## 2019-08-28 DIAGNOSIS — Z299 Encounter for prophylactic measures, unspecified: Secondary | ICD-10-CM | POA: Diagnosis not present

## 2019-08-28 DIAGNOSIS — Z6826 Body mass index (BMI) 26.0-26.9, adult: Secondary | ICD-10-CM | POA: Diagnosis not present

## 2019-08-28 DIAGNOSIS — I10 Essential (primary) hypertension: Secondary | ICD-10-CM | POA: Diagnosis not present

## 2019-08-28 DIAGNOSIS — E119 Type 2 diabetes mellitus without complications: Secondary | ICD-10-CM | POA: Diagnosis not present

## 2019-08-28 DIAGNOSIS — I2510 Atherosclerotic heart disease of native coronary artery without angina pectoris: Secondary | ICD-10-CM | POA: Diagnosis not present

## 2019-09-02 DIAGNOSIS — I10 Essential (primary) hypertension: Secondary | ICD-10-CM | POA: Diagnosis not present

## 2019-09-25 ENCOUNTER — Ambulatory Visit: Payer: Medicare Other | Attending: Internal Medicine

## 2019-09-25 DIAGNOSIS — Z23 Encounter for immunization: Secondary | ICD-10-CM | POA: Insufficient documentation

## 2019-09-25 NOTE — Progress Notes (Signed)
   Covid-19 Vaccination Clinic  Name:  Ariel Rodriguez    MRN: [id number] DOB: 1931/01/28  09/25/2019  [name] was observed post Covid-19 immunization for 15 minutes without incidence. She was provided with Vaccine Information Sheet and instruction to access the V-Safe system.   [name] was instructed to call 911 with any severe reactions post vaccine: Marland Kitchen Difficulty breathing  . Swelling of your face and throat  . A fast heartbeat  . A bad rash all over your body  . Dizziness and weakness    Immunizations Administered    Name Date Dose VIS Date Route   Pfizer COVID-19 Vaccine 09/25/2019 10:26 AM 0.3 mL 08/21/2019 Intramuscular   Manufacturer: [employer], [employer]   Lot: [id number]   [place]: [id number]

## 2019-10-01 DIAGNOSIS — I10 Essential (primary) hypertension: Secondary | ICD-10-CM | POA: Diagnosis not present

## 2019-10-02 DIAGNOSIS — I10 Essential (primary) hypertension: Secondary | ICD-10-CM | POA: Diagnosis not present

## 2019-10-02 DIAGNOSIS — Z299 Encounter for prophylactic measures, unspecified: Secondary | ICD-10-CM | POA: Diagnosis not present

## 2019-10-02 DIAGNOSIS — Z6826 Body mass index (BMI) 26.0-26.9, adult: Secondary | ICD-10-CM | POA: Diagnosis not present

## 2019-10-02 DIAGNOSIS — F320 Major depressive disorder, single episode, mild: Secondary | ICD-10-CM | POA: Diagnosis not present

## 2019-10-02 DIAGNOSIS — E1165 Type 2 diabetes mellitus with hyperglycemia: Secondary | ICD-10-CM | POA: Diagnosis not present

## 2019-10-16 ENCOUNTER — Ambulatory Visit: Payer: Medicare Other | Attending: Internal Medicine

## 2019-10-16 DIAGNOSIS — Z23 Encounter for immunization: Secondary | ICD-10-CM

## 2019-10-16 NOTE — Progress Notes (Signed)
   Covid-19 Vaccination Clinic  Name:  ZONNIE VANDENBOSSCHE    MRN: [id number] DOB: 04-24-1931  10/16/2019  Ms. Hedman was observed post Covid-19 immunization for 15 minutes without incidence. She was provided with Vaccine Information Sheet and instruction to access the V-Safe system.   Ms. Rabadi was instructed to call 911 with any severe reactions post vaccine: Marland Kitchen Difficulty breathing  . Swelling of your face and throat  . A fast heartbeat  . A bad rash all over your body  . Dizziness and weakness    Immunizations Administered    Name Date Dose VIS Date Route   Pfizer COVID-19 Vaccine 10/16/2019  9:41 AM 0.3 mL 08/21/2019 Intramuscular   Manufacturer: [place]   Lot: [id number]   [place]: [id number]

## 2019-11-05 DIAGNOSIS — I10 Essential (primary) hypertension: Secondary | ICD-10-CM | POA: Diagnosis not present

## 2019-12-03 DIAGNOSIS — I10 Essential (primary) hypertension: Secondary | ICD-10-CM | POA: Diagnosis not present

## 2019-12-03 DIAGNOSIS — K589 Irritable bowel syndrome without diarrhea: Secondary | ICD-10-CM | POA: Diagnosis not present

## 2019-12-03 DIAGNOSIS — R110 Nausea: Secondary | ICD-10-CM | POA: Diagnosis not present

## 2019-12-03 DIAGNOSIS — F419 Anxiety disorder, unspecified: Secondary | ICD-10-CM | POA: Diagnosis not present

## 2019-12-03 DIAGNOSIS — E1165 Type 2 diabetes mellitus with hyperglycemia: Secondary | ICD-10-CM | POA: Diagnosis not present

## 2019-12-03 DIAGNOSIS — Z299 Encounter for prophylactic measures, unspecified: Secondary | ICD-10-CM | POA: Diagnosis not present

## 2019-12-08 DIAGNOSIS — I10 Essential (primary) hypertension: Secondary | ICD-10-CM | POA: Diagnosis not present

## 2019-12-23 ENCOUNTER — Ambulatory Visit: Payer: Medicare Other | Admitting: Gastroenterology

## 2019-12-23 ENCOUNTER — Encounter: Payer: Self-pay | Admitting: Gastroenterology

## 2019-12-23 ENCOUNTER — Other Ambulatory Visit: Payer: Self-pay

## 2019-12-23 VITALS — BP 131/72 | HR 71 | Temp 96.8°F | Ht 60.0 in | Wt 133.0 lb

## 2019-12-23 DIAGNOSIS — R110 Nausea: Secondary | ICD-10-CM

## 2019-12-23 DIAGNOSIS — K219 Gastro-esophageal reflux disease without esophagitis: Secondary | ICD-10-CM | POA: Diagnosis not present

## 2019-12-23 DIAGNOSIS — R197 Diarrhea, unspecified: Secondary | ICD-10-CM | POA: Diagnosis not present

## 2019-12-23 MED ORDER — ONDANSETRON HCL 4 MG PO TABS: ORAL_TABLET | ORAL | 0 refills | Status: AC

## 2019-12-23 NOTE — Patient Instructions (Addendum)
1. Zofran 1/2 to 1 tablet every 8 hours as needed for nausea. New RX sent to pharmacy.  2. Please collect stool specimen and return to [facility] (across the street from [place] in the two story brick building/[employer] [employer] [facility]). We will contact you with results as available. 3. Continue pantoprazole 40mg  every morning before breakfast and famotidine 20mg  every evening.  4. Upper endoscopy as scheduled. See separate instructions.

## 2019-12-23 NOTE — Progress Notes (Signed)
Primary Care Physician: Glenda Chroman, MD  Primary Gastroenterologist:  Garfield Cornea, MD   Chief Complaint  Patient presents with  . Nausea    no vomiting  . Diarrhea    HPI: Ariel Rodriguez is a 86 y.o. female here for follow-up nausea.  She was last seen November 2020.  Symptoms are felt to be from protracted course of enteric infection secondary to enteropathogenic E. coli.  At time of last office visit she was doing well.  Reflux also well controlled at that time.  Presents today stating that she feels like things never cleared up.  Over the past 4 weeks however symptoms have been worse she has been having intermittent nausea.  She has nausea persistently.  No vomiting.  Is able to stay hydrated.  Denies abdominal pain. Saw Dr. Woody Seller who added Pepcid 20 mg in the evening.  She continues to take pantoprazole 40 mg every morning.  She is having a lot of reflux and regurgitation.  Denies dysphagia.  Does not seem to matter what she eats.  She continues to have issues with her bowel movements.  She cannot recall the last solid stool.  Since her symptoms started last year she has had mushy stool 2-3 times every day.  Sometimes stools are watery.  No blood per rectum.  Monday she had 2 hours of persistent watery yellow diarrhea.  Associated with rectal burning.  She took Zofran for her nausea which also stopped her bowel movements for nearly 48 hours.  She had a bowel movement this morning which was more formed.  Over the past year she would use Imodium as needed which would control her diarrhea for 2 days.  She is concerned that the infection never went away.  When she was diagnosed with enteropathogenic E. coli last year she was provided 3 days course of azithromycin which she tolerated well.  She denies any recent antibiotic use.  Completed Pfizer Covid vaccine October 16, 2019.    Current Outpatient Medications  Medication Sig Dispense Refill  . ALPRAZolam (XANAX) 0.25 MG tablet  Take 0.25 mg by mouth 2 (two) times daily as needed for anxiety.     Marland Kitchen amLODipine (NORVASC) 5 MG tablet Take 5 mg by mouth at bedtime.    . Ascorbic Acid (VITAMIN C) 500 MG CAPS Take by mouth daily.    Marland Kitchen aspirin EC 81 MG tablet Take 81 mg by mouth daily.    Marland Kitchen atenolol (TENORMIN) 50 MG tablet Take 25 mg by mouth every evening.     Marland Kitchen atorvastatin (LIPITOR) 10 MG tablet Take 10 mg by mouth daily at 6 PM.     . chlorthalidone (HYGROTON) 25 MG tablet Take 12.5 mg by mouth daily.    . cholecalciferol (VITAMIN D) 1000 UNITS tablet Take 1,000 Units by mouth daily.    . Cinnamon 500 MG capsule Take 500 mg by mouth every evening.    . famotidine (PEPCID) 20 MG tablet Take 20 mg by mouth daily.    . isosorbide mononitrate (IMDUR) 30 MG 24 hr tablet Take 1 tablet (30 mg total) by mouth daily. 90 tablet 1  . levothyroxine (SYNTHROID, LEVOTHROID) 75 MCG tablet Take 75 mcg by mouth daily before breakfast.    . lisinopril (PRINIVIL,ZESTRIL) 20 MG tablet Take 20 mg by mouth 2 (two) times daily.    . metFORMIN (GLUCOPHAGE) 500 MG tablet Take 500 mg by mouth 2 (two) times daily with a meal.     .  Multiple Vitamin (MULTIVITAMIN WITH MINERALS) TABS tablet Take 1 tablet by mouth daily.    . nitroGLYCERIN (NITROSTAT) 0.4 MG SL tablet PLACE 1 TABLET UNDER THE TONGUE AT ONSET OF CHEST PAIN EVERY 5 MINTUES UP TO 3 TIMES AS NEEDED (Patient taking differently: Place 0.4 mg under the tongue every 5 (five) minutes as needed for chest pain. ) 25 tablet 3  . Omega-3 Fatty Acids (KP OMEGA-3 FISH OIL) 1200 MG CAPS Take 1,200 mg by mouth daily.    . ondansetron (ZOFRAN) 4 MG tablet Take 1 tablet (4 mg total) by mouth every 8 (eight) hours as needed for nausea. 20 tablet 0  . pantoprazole (PROTONIX) 40 MG tablet Take 40 mg by mouth daily.     . Probiotic Product (PROBIOTIC DAILY PO) Take 1 capsule by mouth at bedtime.    . vitamin B-12 (CYANOCOBALAMIN) 1000 MCG tablet Take 1,000 mcg by mouth daily.     No current  facility-administered medications for this visit.    Allergies as of 12/23/2019 - Review Complete 12/23/2019  Allergen Reaction Noted  . Ciprofloxacin Diarrhea and Nausea And Vomiting 07/07/2013  . Codeine Other (See Comments) 10/03/2016  . Erythromycin Diarrhea and Nausea And Vomiting 07/07/2013  . Prednisone Nausea And Vomiting 12/22/2014  . Sulfa antibiotics Nausea And Vomiting 10/03/2016   Past Medical History:  Diagnosis Date  . Coronary artery disease    LHC (02/09/14):  Mid LAD occluded, patent circumflex, mid RCA sequential 90%, LIMA-LAD patent radial Y graft to distal RCA patent, EF 60%  . Diabetes mellitus without complication ([place])   . Heart attack ([place]) 1999  . Hypertension   . PONV (postoperative nausea and vomiting)    Past Surgical History:  Procedure Laterality Date  . CARDIAC SURGERY    . COLONOSCOPY N/A 10/10/2016   Dr. Gala Romney: single tubular adenoma removed. no future colonoscopies unless develops new symptoms.   Marland Kitchen LAPAROSCOPIC CHOLECYSTECTOMY N/A 05/04/2016  . LEFT HEART CATHETERIZATION WITH CORONARY/GRAFT ANGIOGRAM N/A 02/09/2014   Procedure: LEFT HEART CATHETERIZATION WITH Beatrix Fetters;  Surgeon: Jettie Booze, MD;  Location: [facility] CATH LAB;  Service: Cardiovascular;  Laterality: N/A;  . POLYPECTOMY  10/10/2016   Procedure: POLYPECTOMY;  Surgeon: Daneil Dolin, MD;  [facility];  Service: Endoscopy;;  colon   History reviewed. No pertinent family history. Social History   Tobacco Use  . Smoking status: Never Smoker  . Smokeless tobacco: Never Used  Substance Use Topics  . Alcohol use: No  . Drug use: No    ROS:  General: Negative for anorexia, weight loss, fever, chills, fatigue, weakness. ENT: Negative for hoarseness, difficulty swallowing , nasal congestion. CV: Negative for chest pain, angina, palpitations, dyspnea on exertion, peripheral edema.  Respiratory: Negative for dyspnea at rest, dyspnea on exertion, cough, sputum,  wheezing.  GI: See history of present illness. GU:  Negative for dysuria, hematuria, urinary incontinence, urinary frequency, nocturnal urination.  Endo: Negative for unusual weight change.    Physical Examination:   BP 131/72   Pulse 71   Temp (!) 96.8 F (36 C) (Temporal)   Ht 5' (1.524 m)   Wt 133 lb (60.3 kg)   BMI 25.97 kg/m   General: Well-nourished, well-developed in no acute distress.  Eyes: No icterus. Mouth: masked. Lungs: Clear to auscultation bilaterally.  Heart: Regular rate and rhythm, no murmurs rubs or gallops.  Abdomen: Bowel sounds are normal, nontender, nondistended, no hepatosplenomegaly or masses, no abdominal bruits or hernia , no rebound or  guarding.   Extremities: No lower extremity edema. No clubbing or deformities. Neuro: Alert and oriented x 4   Skin: Warm and dry, no jaundice.   Psych: Alert and cooperative, normal mood and affect.   Imaging Studies: No results found.

## 2019-12-27 NOTE — Assessment & Plan Note (Signed)
Persistent diarrhea since enteric infection secondary to enteropathogenic E.coli in 2020. Doubt lingering infection. She may have post-infectious IBS, microscopic colitis, or new infectious gastroenteritis. Would repeat stool studies as next step.

## 2019-12-27 NOTE — Assessment & Plan Note (Signed)
Chronic intermittent nausea, now persistent. No vomiting. She is chronically on PPI daily and recent addition of Pepcid at night. Still having a lot of heartburn/regurgitation which is likely source of her nausea. Will plan for EGD in the near future. ASA II.  I have discussed the risks, alternatives, benefits with regards to but not limited to the risk of reaction to medication, bleeding, infection, perforation and the patient is agreeable to proceed. Written consent to be obtained.  Zofran as needed for nausea.

## 2019-12-28 ENCOUNTER — Telehealth: Payer: Self-pay | Admitting: Internal Medicine

## 2019-12-28 DIAGNOSIS — I2510 Atherosclerotic heart disease of native coronary artery without angina pectoris: Secondary | ICD-10-CM | POA: Diagnosis not present

## 2019-12-28 DIAGNOSIS — E1165 Type 2 diabetes mellitus with hyperglycemia: Secondary | ICD-10-CM | POA: Diagnosis not present

## 2019-12-28 DIAGNOSIS — I10 Essential (primary) hypertension: Secondary | ICD-10-CM | POA: Diagnosis not present

## 2019-12-28 DIAGNOSIS — Z299 Encounter for prophylactic measures, unspecified: Secondary | ICD-10-CM | POA: Diagnosis not present

## 2019-12-28 DIAGNOSIS — Z789 Other specified health status: Secondary | ICD-10-CM | POA: Diagnosis not present

## 2019-12-28 DIAGNOSIS — R197 Diarrhea, unspecified: Secondary | ICD-10-CM | POA: Diagnosis not present

## 2019-12-28 NOTE — Telephone Encounter (Signed)
Pt called asking to speak with LSL. I told her LSL was with patients and could I take a message. She said that she needed to speak with LSL personally about a test she wants her to do and what was going to be done. [phone]

## 2019-12-28 NOTE — Telephone Encounter (Signed)
Received a call from [place] at [facility]. Pt walked in and has turned in 2 formed stool samples which can't be accepted for stool culture. Pt told [name] that our office put her on Zofran and she isn't able to have loose stool. [name] is going to give her one more stool sample and I will touch basis with pt tomorrow. Pt was notified earlier today to d/c Zofran until she is able to have loose stool and turn the culture in then.

## 2019-12-28 NOTE — Telephone Encounter (Signed)
Spoke with pt. Pt is aware of LSL recommendations and is still requesting Azithromycin. Pt is aware that she should stop Zofran long enough to have loose stool and turn in stool culture. Pt is aware that LSL needs the stool culture to treat her properly.

## 2019-12-28 NOTE — Telephone Encounter (Signed)
Spoke with pt. Pt wants to discuss with [facility]SL starting back on Azithromycin. Pt feels that she has the same Ecoli infection that she had last year. Pt feels she needs to take Azithromycin more than three days. Pt states the Azithromycin did help some when she took it last year. Pt is taking Zofran daily to help with nausea but feels it keeps her from having a BM since she has to take the Zofran. Pt doesn't report diarrhea or vomiting.

## 2019-12-28 NOTE — Telephone Encounter (Signed)
We discussed at her office visit that we need to have her repeat the stool test before considering additional antibiotics. It is unlikely that she has persisting infection from last year with the Ecoli bug she had. We need to consider other pathogens which would not be treated by azithromycin.   If she is agreeable, I would like for her to hold the zofran and collect first loose stool and take to lab.

## 2019-12-29 NOTE — Telephone Encounter (Signed)
Spoke with pt. Pt notified of c diff recommendations. Pt is to have c diff cultures completed when her stool is very loose. Pt is aware.

## 2020-01-04 ENCOUNTER — Inpatient Hospital Stay (HOSPITAL_BASED_OUTPATIENT_CLINIC_OR_DEPARTMENT_OTHER): Payer: PPO

## 2020-01-04 ENCOUNTER — Other Ambulatory Visit: Payer: Self-pay

## 2020-01-04 ENCOUNTER — Observation Stay (HOSPITAL_COMMUNITY)
Admission: EM | Admit: 2020-01-04 | Discharge: 2020-01-05 | Disposition: A | Discharge status: Home / Self Care | Payer: PPO | Attending: Internal Medicine | Admitting: Internal Medicine

## 2020-01-04 ENCOUNTER — Encounter (HOSPITAL_COMMUNITY): Payer: Self-pay | Admitting: Emergency Medicine

## 2020-01-04 ENCOUNTER — Emergency Department (HOSPITAL_COMMUNITY): Payer: PPO

## 2020-01-04 DIAGNOSIS — K219 Gastro-esophageal reflux disease without esophagitis: Secondary | ICD-10-CM | POA: Diagnosis not present

## 2020-01-04 DIAGNOSIS — E871 Hypo-osmolality and hyponatremia: Secondary | ICD-10-CM | POA: Diagnosis not present

## 2020-01-04 DIAGNOSIS — Z79890 Hormone replacement therapy (postmenopausal): Secondary | ICD-10-CM | POA: Diagnosis not present

## 2020-01-04 DIAGNOSIS — E785 Hyperlipidemia, unspecified: Secondary | ICD-10-CM | POA: Insufficient documentation

## 2020-01-04 DIAGNOSIS — G4489 Other headache syndrome: Secondary | ICD-10-CM | POA: Diagnosis not present

## 2020-01-04 DIAGNOSIS — Z7984 Long term (current) use of oral hypoglycemic drugs: Secondary | ICD-10-CM | POA: Diagnosis not present

## 2020-01-04 DIAGNOSIS — I10 Essential (primary) hypertension: Secondary | ICD-10-CM | POA: Insufficient documentation

## 2020-01-04 DIAGNOSIS — R0602 Shortness of breath: Secondary | ICD-10-CM | POA: Diagnosis not present

## 2020-01-04 DIAGNOSIS — E039 Hypothyroidism, unspecified: Secondary | ICD-10-CM | POA: Insufficient documentation

## 2020-01-04 DIAGNOSIS — M549 Dorsalgia, unspecified: Secondary | ICD-10-CM | POA: Diagnosis not present

## 2020-01-04 DIAGNOSIS — Z882 Allergy status to sulfonamides status: Secondary | ICD-10-CM | POA: Insufficient documentation

## 2020-01-04 DIAGNOSIS — I2510 Atherosclerotic heart disease of native coronary artery without angina pectoris: Secondary | ICD-10-CM | POA: Diagnosis not present

## 2020-01-04 DIAGNOSIS — E119 Type 2 diabetes mellitus without complications: Secondary | ICD-10-CM | POA: Diagnosis not present

## 2020-01-04 DIAGNOSIS — Z7982 Long term (current) use of aspirin: Secondary | ICD-10-CM | POA: Diagnosis not present

## 2020-01-04 DIAGNOSIS — I252 Old myocardial infarction: Secondary | ICD-10-CM | POA: Insufficient documentation

## 2020-01-04 DIAGNOSIS — R079 Chest pain, unspecified: Secondary | ICD-10-CM

## 2020-01-04 DIAGNOSIS — R0789 Other chest pain: Secondary | ICD-10-CM | POA: Diagnosis not present

## 2020-01-04 DIAGNOSIS — Z881 Allergy status to other antibiotic agents status: Secondary | ICD-10-CM | POA: Insufficient documentation

## 2020-01-04 DIAGNOSIS — Z951 Presence of aortocoronary bypass graft: Secondary | ICD-10-CM | POA: Insufficient documentation

## 2020-01-04 DIAGNOSIS — M5489 Other dorsalgia: Secondary | ICD-10-CM | POA: Diagnosis not present

## 2020-01-04 DIAGNOSIS — Z20822 Contact with and (suspected) exposure to covid-19: Secondary | ICD-10-CM | POA: Diagnosis not present

## 2020-01-04 DIAGNOSIS — Z79899 Other long term (current) drug therapy: Secondary | ICD-10-CM | POA: Insufficient documentation

## 2020-01-04 DIAGNOSIS — I454 Nonspecific intraventricular block: Secondary | ICD-10-CM | POA: Diagnosis not present

## 2020-01-04 DIAGNOSIS — Z888 Allergy status to other drugs, medicaments and biological substances status: Secondary | ICD-10-CM | POA: Diagnosis not present

## 2020-01-04 DIAGNOSIS — Z885 Allergy status to narcotic agent status: Secondary | ICD-10-CM | POA: Diagnosis not present

## 2020-01-04 LAB — CBC WITH DIFFERENTIAL/PLATELET
Abs Immature Granulocytes: 0.03 10*3/uL (ref 0.00–0.07)
Basophils Absolute: 0.1 10*3/uL (ref 0.0–0.1)
Basophils Relative: 1 %
Eosinophils Absolute: 0.4 10*3/uL (ref 0.0–0.5)
Eosinophils Relative: 4 %
HCT: 38 % (ref 36.0–46.0)
Hemoglobin: 13.1 g/dL (ref 12.0–15.0)
Immature Granulocytes: 0 %
Lymphocytes Relative: 20 %
Lymphs Abs: 1.8 10*3/uL (ref 0.7–4.0)
MCH: 30.8 pg (ref 26.0–34.0)
MCHC: 34.5 g/dL (ref 30.0–36.0)
MCV: 89.4 fL (ref 80.0–100.0)
Monocytes Absolute: 1 10*3/uL (ref 0.1–1.0)
Monocytes Relative: 11 %
Neutro Abs: 5.5 10*3/uL (ref 1.7–7.7)
Neutrophils Relative %: 64 %
Platelets: 243 10*3/uL (ref 150–400)
RBC: 4.25 MIL/uL (ref 3.87–5.11)
RDW: 12.3 % (ref 11.5–15.5)
WBC: 8.8 10*3/uL (ref 4.0–10.5)
nRBC: 0 % (ref 0.0–0.2)

## 2020-01-04 LAB — BASIC METABOLIC PANEL
Anion gap: 14 (ref 5–15)
Anion gap: 12 (ref 5–15)
Anion gap: 11 (ref 5–15)
BUN: 14 mg/dL (ref 8–23)
BUN: 13 mg/dL (ref 8–23)
BUN: 18 mg/dL (ref 8–23)
CO2: 24 mmol/L (ref 22–32)
CO2: 24 mmol/L (ref 22–32)
CO2: 26 mmol/L (ref 22–32)
Calcium: 10.1 mg/dL (ref 8.9–10.3)
Calcium: 9.2 mg/dL (ref 8.9–10.3)
Calcium: 9.2 mg/dL (ref 8.9–10.3)
Chloride: 91 mmol/L — ABNORMAL LOW (ref 98–111)
Chloride: 93 mmol/L — ABNORMAL LOW (ref 98–111)
Chloride: 92 mmol/L — ABNORMAL LOW (ref 98–111)
Creatinine, Ser: 1.02 mg/dL — ABNORMAL HIGH (ref 0.44–1.00)
Creatinine, Ser: 0.96 mg/dL (ref 0.44–1.00)
Creatinine, Ser: 1.13 mg/dL — ABNORMAL HIGH (ref 0.44–1.00)
GFR calc Af Amer: 57 mL/min — ABNORMAL LOW (ref >60)
GFR calc Af Amer: >60 mL/min (ref >60)
GFR calc Af Amer: 51 mL/min — ABNORMAL LOW (ref >60)
GFR calc non Af Amer: 49 mL/min — ABNORMAL LOW (ref >60)
GFR calc non Af Amer: 53 mL/min — ABNORMAL LOW (ref >60)
GFR calc non Af Amer: 44 mL/min — ABNORMAL LOW (ref >60)
Glucose, Bld: 143 mg/dL — ABNORMAL HIGH (ref 70–99)
Glucose, Bld: 182 mg/dL — ABNORMAL HIGH (ref 70–99)
Glucose, Bld: 145 mg/dL — ABNORMAL HIGH (ref 70–99)
Potassium: 3.9 mmol/L (ref 3.5–5.1)
Potassium: 3.9 mmol/L (ref 3.5–5.1)
Potassium: 3.9 mmol/L (ref 3.5–5.1)
Sodium: 129 mmol/L — ABNORMAL LOW (ref 135–145)
Sodium: 129 mmol/L — ABNORMAL LOW (ref 135–145)
Sodium: 129 mmol/L — ABNORMAL LOW (ref 135–145)

## 2020-01-04 LAB — CBG MONITORING, ED
Glucose-Capillary: 167 mg/dL — ABNORMAL HIGH (ref 70–99)
Glucose-Capillary: 135 mg/dL — ABNORMAL HIGH (ref 70–99)

## 2020-01-04 LAB — ECHOCARDIOGRAM COMPLETE: Weight: 2128.06 oz

## 2020-01-04 LAB — HEMOGLOBIN A1C
Hgb A1c MFr Bld: 7.3 % — ABNORMAL HIGH (ref 4.8–5.6)
Mean Plasma Glucose: 162.81 mg/dL

## 2020-01-04 LAB — BRAIN NATRIURETIC PEPTIDE: B Natriuretic Peptide: 67.1 pg/mL (ref 0.0–100.0)

## 2020-01-04 LAB — OSMOLALITY: Osmolality: 274 mOsm/kg — ABNORMAL LOW (ref 275–295)

## 2020-01-04 LAB — TROPONIN I (HIGH SENSITIVITY)
Troponin I (High Sensitivity): 5 ng/L (ref <18)
Troponin I (High Sensitivity): 7 ng/L (ref <18)
Troponin I (High Sensitivity): 6 ng/L (ref <18)

## 2020-01-04 LAB — RESPIRATORY PANEL BY RT PCR (FLU A&B, COVID)
Influenza A by PCR: NEGATIVE
Influenza B by PCR: NEGATIVE
SARS Coronavirus 2 by RT PCR: NEGATIVE

## 2020-01-04 LAB — OSMOLALITY, URINE: Osmolality, Ur: 570 mOsm/kg (ref 300–900)

## 2020-01-04 LAB — SODIUM, URINE, RANDOM: Sodium, Ur: 110 mmol/L

## 2020-01-04 MED ORDER — MORPHINE SULFATE (PF) 2 MG/ML IV SOLN
2.0000 mg | Freq: Once | INTRAVENOUS | Status: AC
  Administered 2020-01-04: 07:00:00 2 mg via INTRAVENOUS
  Filled 2020-01-04: qty 1

## 2020-01-04 MED ORDER — ALUM & MAG HYDROXIDE-SIMETH 200-200-20 MG/5ML PO SUSP
30.0000 mL | Freq: Once | ORAL | Status: AC
  Administered 2020-01-04: 30 mL via ORAL
  Filled 2020-01-04: qty 30

## 2020-01-04 MED ORDER — ASPIRIN EC 81 MG PO TBEC
81.0000 mg | DELAYED_RELEASE_TABLET | Freq: Every day | ORAL | Status: DC
  Administered 2020-01-05: 10:00:00 81 mg via ORAL
  Filled 2020-01-04: qty 1

## 2020-01-04 MED ORDER — DICYCLOMINE HCL 10 MG/5ML PO SOLN
10.0000 mg | Freq: Once | ORAL | Status: AC
  Administered 2020-01-04: 11:00:00 10 mg via ORAL
  Filled 2020-01-04: qty 5

## 2020-01-04 MED ORDER — ALUM & MAG HYDROXIDE-SIMETH 200-200-20 MG/5ML PO SUSP: 30.0000 mL | Freq: Once | ORAL | Status: DC

## 2020-01-04 MED ORDER — LEVOTHYROXINE SODIUM 75 MCG PO TABS
75.0000 ug | ORAL_TABLET | Freq: Every day | ORAL | Status: DC
  Administered 2020-01-04 – 2020-01-05 (×2): 75 ug via ORAL
  Filled 2020-01-04 (×2): qty 1

## 2020-01-04 MED ORDER — ACETAMINOPHEN 325 MG PO TABS: 650.0000 mg | ORAL_TABLET | ORAL | Status: DC | PRN

## 2020-01-04 MED ORDER — SODIUM CHLORIDE 0.9 % IV BOLUS
500.0000 mL | Freq: Once | INTRAVENOUS | Status: AC
  Administered 2020-01-04: 08:00:00 500 mL via INTRAVENOUS

## 2020-01-04 MED ORDER — ATORVASTATIN CALCIUM 10 MG PO TABS
10.0000 mg | ORAL_TABLET | Freq: Every day | ORAL | Status: DC
  Administered 2020-01-04: 21:00:00 10 mg via ORAL
  Filled 2020-01-04: qty 1

## 2020-01-04 MED ORDER — NITROGLYCERIN IN D5W 200-5 MCG/ML-% IV SOLN
0.0000 ug/min | INTRAVENOUS | Status: DC
  Administered 2020-01-04: 13:00:00 5 ug/min via INTRAVENOUS
  Filled 2020-01-04: qty 250

## 2020-01-04 MED ORDER — NITROGLYCERIN 0.4 MG SL SUBL
0.4000 mg | SUBLINGUAL_TABLET | SUBLINGUAL | Status: AC | PRN
  Administered 2020-01-04 (×3): 0.4 mg via SUBLINGUAL
  Filled 2020-01-04: qty 1

## 2020-01-04 MED ORDER — ONDANSETRON HCL 4 MG/2ML IJ SOLN
4.0000 mg | Freq: Once | INTRAMUSCULAR | Status: AC
  Administered 2020-01-04: 07:00:00 4 mg via INTRAVENOUS
  Filled 2020-01-04: qty 2

## 2020-01-04 MED ORDER — HEPARIN SODIUM (PORCINE) 5000 UNIT/ML IJ SOLN
5000.0000 [IU] | Freq: Three times a day (TID) | INTRAMUSCULAR | Status: DC
  Administered 2020-01-04 – 2020-01-05 (×3): 5000 [IU] via SUBCUTANEOUS
  Filled 2020-01-04 (×3): qty 1

## 2020-01-04 MED ORDER — NITROGLYCERIN 0.4 MG SL SUBL: 0.4000 mg | SUBLINGUAL_TABLET | SUBLINGUAL | Status: DC | PRN

## 2020-01-04 MED ORDER — ATENOLOL 25 MG PO TABS
25.0000 mg | ORAL_TABLET | Freq: Every evening | ORAL | Status: DC
  Administered 2020-01-04: 21:00:00 25 mg via ORAL
  Filled 2020-01-04: qty 1

## 2020-01-04 MED ORDER — HYOSCYAMINE SULFATE 0.125 MG SL SUBL
0.2500 mg | SUBLINGUAL_TABLET | Freq: Once | SUBLINGUAL | Status: AC
  Administered 2020-01-04: 10:00:00 0.25 mg via SUBLINGUAL
  Filled 2020-01-04: qty 2

## 2020-01-04 MED ORDER — LIDOCAINE VISCOUS HCL 2 % MT SOLN
15.0000 mL | Freq: Once | OROMUCOSAL | Status: AC
  Administered 2020-01-04: 13:00:00 15 mL via ORAL
  Filled 2020-01-04: qty 15

## 2020-01-04 MED ORDER — AMLODIPINE BESYLATE 5 MG PO TABS
5.0000 mg | ORAL_TABLET | Freq: Every day | ORAL | Status: DC
  Administered 2020-01-04: 21:00:00 5 mg via ORAL
  Filled 2020-01-04: qty 1

## 2020-01-04 MED ORDER — PANTOPRAZOLE SODIUM 40 MG PO TBEC
40.0000 mg | DELAYED_RELEASE_TABLET | Freq: Every day | ORAL | Status: DC
  Administered 2020-01-04 – 2020-01-05 (×2): 40 mg via ORAL
  Filled 2020-01-04 (×2): qty 1

## 2020-01-04 MED ORDER — HEPARIN SODIUM (PORCINE) 5000 UNIT/ML IJ SOLN
5000.0000 [IU] | Freq: Three times a day (TID) | INTRAMUSCULAR | Status: DC
  Administered 2020-01-04: 10:00:00 5000 [IU] via SUBCUTANEOUS
  Filled 2020-01-04: qty 1

## 2020-01-04 MED ORDER — PERFLUTREN LIPID MICROSPHERE
1.0000 mL | INTRAVENOUS | Status: AC | PRN
  Administered 2020-01-04: 17:00:00 5 mL via INTRAVENOUS
  Filled 2020-01-04: qty 10

## 2020-01-04 MED ORDER — ONDANSETRON HCL 4 MG/2ML IJ SOLN: 4.0000 mg | Freq: Four times a day (QID) | INTRAMUSCULAR | Status: DC | PRN

## 2020-01-04 MED ORDER — INSULIN ASPART 100 UNIT/ML ~~LOC~~ SOLN
0.0000 [IU] | Freq: Three times a day (TID) | SUBCUTANEOUS | Status: DC
  Administered 2020-01-04: 1 [IU] via SUBCUTANEOUS
  Administered 2020-01-04: 13:00:00 2 [IU] via SUBCUTANEOUS
  Administered 2020-01-05 (×2): 1 [IU] via SUBCUTANEOUS

## 2020-01-04 MED ORDER — ISOSORBIDE MONONITRATE ER 30 MG PO TB24
30.0000 mg | ORAL_TABLET | Freq: Every day | ORAL | Status: DC
  Administered 2020-01-04 – 2020-01-05 (×2): 30 mg via ORAL
  Filled 2020-01-04 (×2): qty 1

## 2020-01-04 MED ORDER — MORPHINE SULFATE (PF) 2 MG/ML IV SOLN
2.0000 mg | Freq: Once | INTRAVENOUS | Status: AC
  Administered 2020-01-04: 08:00:00 2 mg via INTRAVENOUS
  Filled 2020-01-04: qty 1

## 2020-01-04 MED ORDER — LISINOPRIL 10 MG PO TABS
20.0000 mg | ORAL_TABLET | Freq: Two times a day (BID) | ORAL | Status: DC
  Administered 2020-01-04 – 2020-01-05 (×3): 20 mg via ORAL
  Filled 2020-01-04: qty 2
  Filled 2020-01-04 (×2): qty 1

## 2020-01-04 NOTE — ED Notes (Signed)
Paged cards to RN

## 2020-01-04 NOTE — ED Notes (Signed)
Wheeled patient back from the bathroom patient is now back in bed on the monitor with family at bedside and call bell in reach

## 2020-01-04 NOTE — ED Provider Notes (Addendum)
MSE was initiated and I personally evaluated the patient and placed orders (if any) at  6:02 AM on January 04, 2020.  The patient appears stable so that the remainder of the MSE may be completed by another provider.   86 year old female with history of CAD status post CABG who presents to the emergency department with 5 days of back pain, chest pressure, shortness of breath that feels similar to her previous anginal equivalent.  Reports symptoms improve when she is lying flat and takes deep breaths.  Took aspirin prior to arrival but no nitroglycerin given with EMS.  Hemodynamically stable.  EKG shows no new ischemic changes.  Cardiac work-up initiated.  Will give nitroglycerin.    EKG Interpretation  Date/Time:  Monday January 04 2020 05:50:36 EDT Ventricular Rate:  67 PR Interval:    QRS Duration: 107 QT Interval:  395 QTC Calculation: 417 R Axis:   -54 Text Interpretation: Sinus or ectopic atrial rhythm Left anterior fascicular block Anterior infarct, old Nonspecific T abnormalities, lateral leads ST elevation, consider inferior injury No significant change since last tracing in August 2017 Confirmed by Pryor Curia [phone]) on 01/04/2020 5:53:35 AM         Arnetra Terris, Delice Bison, DO 01/04/20 0603   No improvement with 3 doses of NTG.  Will give morphine and zofran.   Christo Hain, Delice Bison, DO 01/04/20 [phone]

## 2020-01-04 NOTE — Progress Notes (Signed)
  Echocardiogram 2D Echocardiogram has been performed.  Ariel Rodriguez 01/04/2020, 5:01 PM

## 2020-01-04 NOTE — ED Notes (Signed)
PAGED ADMITTING TO RN  

## 2020-01-04 NOTE — ED Triage Notes (Signed)
Given 324mg  ASA PTA, no NTG

## 2020-01-04 NOTE — Consult Note (Addendum)
Cardiology Consultation:   Patient ID: Ariel Rodriguez; WG:[id number]; [birth year]   Admit date: 01/04/2020 Date of Consult: 01/04/2020  Primary Care Provider: Glenda Chroman, MD Primary Cardiologist: Minus Breeding, MD 06/24/2019 Primary Electrophysiologist:  None   Patient Profile:   Ariel Rodriguez is a 86 y.o. female with a hx of CABG 1999, cath 2015 w/ patent grafts, DM, HTN, HLD, who is being seen today for the evaluation of chest pain at the request of Dr Rebeca Alert.  History of Present Illness:   [name] has been having occasional mid scapular pain for months.  She states this is her angina.  She has been noticing the pain whenever she is up doing something and is using her arms.  She will rest and take deep breaths, the pain will resolve.  Starting 5 days ago, the chest pain episodes became much more frequent.  She was getting them every day, sometimes more than 1 a day.  Each 1 was the same, the intensity reaching a 4-5/10.  The episodes would be associated with chest tightness.  The severity of the back pain is more than the chest tightness, so she is unable to rate the chest tightness.  She will get some shortness of breath with the episodes and has been dealing with chronic nausea for months, but no vomiting or diaphoresis.  Last p.m., the pain started and would not stop.  It was worse than usual, reaching a 6 or 7/10.  She did not take anything for it.  She took deep breaths and laid down.  Laying on her left side did not help.  She was able to get some rest, but was awakened by the pain multiple times and did not get much rest.  When the pain did not resolve, she came to the emergency room.  In the emergency room, she got 3 sublingual nitroglycerin but does not feel this helped the pain very much.  She got morphine 2 mg x 2 doses and feels this helped the pain.  However, when she was moving around in the bed and gesturing with her arms, she said it made the pain worse.  It is  currently a 3 or 4/10.  The mid scapular pain is her usual angina.  She has had GI issues since last year.  They include chronic intermittent nausea generally without vomiting.  This is despite the addition of PPI during the day and Pepcid at night.  Her GI problems started when she had enteropathogenic E. coli 07/2019.  She was also recently having loose stools and there was concern for C. difficile, but the testing is not completed.   Past Medical History:  Diagnosis Date   Coronary artery disease    LHC (02/09/14):  Mid LAD occluded, patent circumflex, mid RCA sequential 90%, LIMA-LAD patent radial Y graft to distal RCA patent, EF 60%   Diabetes mellitus without complication (Ariel Rodriguez)    Heart attack ([place]) 1999   Hypertension    PONV (postoperative nausea and vomiting)     Past Surgical History:  Procedure Laterality Date   CARDIAC SURGERY     COLONOSCOPY N/A 10/10/2016   Dr. Gala Romney: single tubular adenoma removed. no future colonoscopies unless develops new symptoms.    LAPAROSCOPIC CHOLECYSTECTOMY N/A 05/04/2016   LEFT HEART CATHETERIZATION WITH CORONARY/GRAFT ANGIOGRAM N/A 02/09/2014   Procedure: LEFT HEART CATHETERIZATION WITH Beatrix Fetters;  Surgeon: Jettie Booze, MD;  Location: [facility] CATH LAB;  Service: Cardiovascular;  Laterality: N/A;   POLYPECTOMY  10/10/2016   Procedure: POLYPECTOMY;  Surgeon: Daneil Dolin, MD;  Location: [facility] ENDO SUITE;  Service: Endoscopy;;  colon     Prior to Admission medications   Medication Sig Start Date End Date Taking? Authorizing Provider  ALPRAZolam (XANAX) 0.25 MG tablet Take 0.25 mg by mouth 2 (two) times daily as needed for anxiety.     [provider]  amLODipine (NORVASC) 5 MG tablet Take 5 mg by mouth at bedtime.    [provider]  Ascorbic Acid (VITAMIN C) 500 MG CAPS Take by mouth daily.    [provider]  aspirin EC 81 MG tablet Take 81 mg by mouth daily.    [provider]    atenolol (TENORMIN) 50 MG tablet Take 25 mg by mouth every evening.     [provider]  atorvastatin (LIPITOR) 10 MG tablet Take 10 mg by mouth daily at 6 PM.     [provider]  chlorthalidone (HYGROTON) 25 MG tablet Take 12.5 mg by mouth daily.    [provider]  cholecalciferol (VITAMIN D) 1000 UNITS tablet Take 1,000 Units by mouth daily.    [provider]  Cinnamon 500 MG capsule Take 500 mg by mouth every evening.    [provider]  famotidine (PEPCID) 20 MG tablet Take 20 mg by mouth daily.    [provider]  isosorbide mononitrate (IMDUR) 30 MG 24 hr tablet Take 1 tablet (30 mg total) by mouth daily. 05/26/15   Minus Breeding, MD  levothyroxine (SYNTHROID, LEVOTHROID) 75 MCG tablet Take 75 mcg by mouth daily before breakfast.    [provider]  lisinopril (PRINIVIL,ZESTRIL) 20 MG tablet Take 20 mg by mouth 2 (two) times daily.    [provider]  metFORMIN (GLUCOPHAGE) 500 MG tablet Take 500 mg by mouth 2 (two) times daily with a meal.     [provider]  Multiple Vitamin (MULTIVITAMIN WITH MINERALS) TABS tablet Take 1 tablet by mouth daily.    [provider]  nitroGLYCERIN (NITROSTAT) 0.4 MG SL tablet PLACE 1 TABLET UNDER THE TONGUE AT ONSET OF CHEST PAIN EVERY 5 MINTUES UP TO 3 TIMES AS NEEDED Patient taking differently: Place 0.4 mg under the tongue every 5 (five) minutes as needed for chest pain.  07/11/18   Minus Breeding, MD  Omega-3 Fatty Acids (KP OMEGA-3 FISH OIL) 1200 MG CAPS Take 1,200 mg by mouth daily.    [provider]  ondansetron (ZOFRAN) 4 MG tablet Take 1/2 to 1 tablet every 8 hours as needed for nausea. 12/23/19   Mahala Menghini, PA-C  pantoprazole (PROTONIX) 40 MG tablet Take 40 mg by mouth daily.     [provider]  Probiotic Product (PROBIOTIC DAILY PO) Take 1 capsule by mouth at bedtime.    [provider]  vitamin B-12 (CYANOCOBALAMIN) 1000  MCG tablet Take 1,000 mcg by mouth daily.    [provider]    Inpatient Medications: Scheduled Meds:  amLODipine  5 mg Oral QHS   [START ON 01/05/2020] aspirin EC  81 mg Oral Daily   atenolol  25 mg Oral QPM   atorvastatin  10 mg Oral q1800   heparin  5,000 Units Subcutaneous Q8H   insulin aspart  0-9 Units Subcutaneous TID WC   isosorbide mononitrate  30 mg Oral Daily   levothyroxine  75 mcg Oral QAC breakfast   lisinopril  20 mg Oral BID   pantoprazole  40 mg Oral  Daily   Continuous Infusions:  PRN Meds: acetaminophen, nitroGLYCERIN, ondansetron (ZOFRAN) IV  Allergies:    Allergies  Allergen Reactions   Ciprofloxacin Diarrhea and Nausea And Vomiting   Codeine Other (See Comments)    Pt went to hospital for flu and states that kept getting worse with med.    Erythromycin Diarrhea and Nausea And Vomiting   Prednisone Nausea And Vomiting   Sulfa Antibiotics Nausea And Vomiting    Social History:   Social History   Socioeconomic History   Marital status: Married    Spouse name: Not on file   Number of children: Not on file   Years of education: Not on file   Highest education level: Not on file  Occupational History   Not on file  Tobacco Use   Smoking status: Never Smoker   Smokeless tobacco: Never Used  Substance and Sexual Activity   Alcohol use: No   Drug use: No   Sexual activity: Not on file  Other Topics Concern   Not on file  Social History Narrative   Not on file   Social Determinants of Health   Financial Resource Strain:    Difficulty of Paying Living Expenses:   Food Insecurity:    Worried About Charity fundraiser in the Last Year:    Arboriculturist in the Last Year:   Transportation Needs:    Film/video editor (Medical):    Lack of Transportation (Non-Medical):   Physical Activity:    Days of Exercise per Week:    Minutes of Exercise per Session:   Stress:    Feeling of Stress :     Social Connections:    Frequency of Communication with Friends and Family:    Frequency of Social Gatherings with Friends and Family:    Attends Religious Services:    Active Member of Clubs or Organizations:    Attends Music therapist:    Marital Status:   Intimate Partner Violence:    Fear of Current or Ex-Partner:    Emotionally Abused:    Physically Abused:    Sexually Abused:     Family History:   History reviewed. No pertinent family history. Family Status:  Family Status  Relation Name Status   Father  Deceased   Mother  Deceased    ROS:  Please see the history of present illness.  All other ROS reviewed and negative.     Physical Exam/Data:   Vitals:   01/04/20 0815 01/04/20 0845 01/04/20 0900 01/04/20 0930  BP: 136/63 (!) 142/80 (!) 135/98 (!) 155/70  Pulse: 64 (!) 59 64 60  Resp: 13 16 15 15   Temp:      TempSrc:      SpO2: 98% 95% (!) 89% 97%  Weight:       No intake or output data in the 24 hours ending 01/04/20 0957  Last 3 Weights 01/04/2020 12/23/2019 07/28/2019  Weight (lbs) 133 lb 0.1 oz 133 lb 132 lb 6.4 oz  Weight (kg) 60.33 kg 60.328 kg 60.056 kg     Body mass index is 25.98 kg/m.   General:  Well nourished, well developed, female in no acute distress HEENT: normal Lymph: no adenopathy Neck: JVD -not elevated Endocrine:  No thryomegaly Vascular: No carotid bruits; 4/4 extremity pulses 2+  Cardiac:  normal S1, S2; RRR; no murmur Lungs:  clear bilaterally, no wheezing, rhonchi or rales  Abd: soft, nontender, no hepatomegaly  Ext: no edema Musculoskeletal:  No deformities, BUE and BLE strength normal and equal Skin: warm and dry  Neuro:  CNs 2-12 intact, no focal abnormalities noted Psych:  Normal affect   EKG:  The EKG was personally reviewed and demonstrates:  04/26 ECG is SR, HR 67, no acute ischemic changes, minor change from 06/11/2019 Telemetry:  Telemetry was personally reviewed and demonstrates:   SR   CV studies:   ECHO: ordered  CATH: 02/09/2014 ANGIOGRAPHIC DATA:   The left main coronary artery is widely patent.  The left anterior descending artery is a large vessel proximally. It is heavily calcified and tapers down in the mid segment. It is occluded at a septal perforator the mid LAD.  The LIMA to LAD is widely patent.  The left circumflex artery is a medium size vessel. There is a small ramus which is patent. The OM1 is patent. The remainder of the circumflex is small but patent.  The right coronary artery is a large dominant vessel. In the mid RCA, there is sequential 90% stenoses with diffuse disease. The posterior lateral artery is large and has competitive flow.  There is a radial graft which is anastomosed in an end-to-side fashion with the LIMA. It anastomoses to a branch of the distal right circulation. It loops around the apex. This graft appears widely patent.  LEFT VENTRICULOGRAM:  Left ventricular angiogram was done in the 30 LAO projection and revealed normal left ventricular wall motion and systolic function with an estimated ejection fraction of 60 %.  LVEDP was 16 mmHg.  We did the ventriculogram in LAO since we did not know where the grafts were and were trying to visualize the origin. No grafts were noted and our attention was turned to the LIMA.  IMPRESSIONS:  1. Patent left main coronary artery. 2. Occluded mid left anterior descending artery.  Patent LIMA to LAD. 3. Patent left circumflex artery and its branches. 4. Severely diseased mid right coronary artery.  Radial artery to the distal RCA circulation is patent. It originates from an anastomosis to the LIMA, and a Y graft pattern. 5. Normal left ventricular systolic function.  LVEDP 16 mmHg.  Ejection fraction 60 %.  RECOMMENDATION:  Continue aggressive medical therapy. I stopped the Brilinta since she did not receive a stent.  Further medical therapy per Dr. Marlou Porch.    Laboratory Data:    Chemistry Recent Labs  Lab 01/04/20 0600  NA 129*  K 3.9  CL 91*  CO2 24  GLUCOSE 143*  BUN 14  CREATININE 1.02*  CALCIUM 10.1  GFRNONAA 49*  GFRAA 57*  ANIONGAP 14    Lab Results  Component Value Date   ALT 23 04/18/2019   AST 24 04/18/2019   ALKPHOS 44 04/18/2019   BILITOT 0.6 04/18/2019   Hematology Recent Labs  Lab 01/04/20 0600  WBC 8.8  RBC 4.25  HGB 13.1  HCT 38.0  MCV 89.4  MCH 30.8  MCHC 34.5  RDW 12.3  PLT 243   Cardiac Enzymes High Sensitivity Troponin:   Recent Labs  Lab 01/04/20 0600 01/04/20 0806  TROPONINIHS 5 7      BNP Recent Labs  Lab 01/04/20 0600  BNP 67.1    TSH:  Lab Results  Component Value Date   TSH 3.860 02/08/2014   Lipids: Lab Results  Component Value Date   CHOL 149 02/09/2014   HDL 44 02/09/2014   LDLCALC 63 02/09/2014   TRIG 208 (H) 02/09/2014   CHOLHDL 3.4 02/09/2014   HgbA1c:  Lab Results  Component Value Date   HGBA1C 6.4 (H) 02/08/2014   Magnesium: No results found for: MG   Radiology/Studies:  DG Chest Portable 1 View  Result Date: 01/04/2020 CLINICAL DATA:  Back pain and shortness of breath EXAM: PORTABLE CHEST 1 VIEW COMPARISON:  04/18/2019 FINDINGS: Normal heart size for technique. CABG. There is no edema, consolidation, effusion, or pneumothorax. IMPRESSION: No acute or interval finding. Electronically Signed   By: Monte Fantasia M.D.   On: 01/04/2020 06:27    Assessment and Plan:   1.  Chest pain: - no clear ECG changes and minimal troponin elevation despite continuous pain since last pm - however, the sx are consistent w/ her usual angina - she was having these sx in 2015 when she had her last cath, med rx w/ patent grafts and no critical distal dz - BP is elevated, will try IV nitro to bring BP down and help CP as well  2. GI issues - has N&V, nausea has been chronic, vomiting unusual - had Mylanta once, will try GI cocktail   o/w per IM Active Problems:   Chest  pain     For questions or updates, please contact [place] Please consult [email or web address] for contact info under Cardiology/STEMI.   Signed, Rosaria Ferries, PA-C  01/04/2020 9:57 AM  History and all data above reviewed.  Patient examined.  I agree with the findings as above.  The patient presents with back pain.  This is similar to previous discomfort she has had both that was thought to represent angina and at the time that she had patent bypass grafts.  It has been ongoing for several days and continuous for review.  There was no relief with nitroglycerin.  There were no acute EKG changes.  Enzymes have been negative.  The patient reports some discomfort with movement.  She has had some nausea that has been chronic and has had difficulty with any clear etiology being identified.  She actually is scheduled to have an EGD.  The patient exam reveals COR:RRR  ,  Lungs: Clear  ,  Abd: Positive bowel sounds, no rebound no guarding, Ext No edema  .  All available labs, radiology testing, previous records reviewed. Agree with documented assessment and plan.   Back pain: Given the absence of objective evidence of ischemia and the fact that she in particular has a negative high-sensitivity troponin I think the pretest probability that this is acute coronary syndrome is very low.  Do not think further cardiac work-up is suggested.  At this point I am not planning further work-up and we will discontinue her IV nitroglycerin.  I would consider nonanginal etiologies.  Of note preliminary echo images demonstrate well-preserved ejection fraction.   Jeneen Rinks Milia Warth  6:05 PM  01/04/2020

## 2020-01-04 NOTE — H&P (Addendum)
Date: 01/04/2020               Patient Name:  Ariel Rodriguez MRN: [id number]  DOB: [birth year] Age / Sex: 86 y.o., female   PCP: Glenda Chroman, MD         Medical Service: Internal Medicine Teaching Service         Attending Physician: Dr. Oda Kilts, MD    First Contact: Dr. Sheppard Coil Pager: [phone]  Second Contact: Dr. Myrtie Hawk  Pager: [phone]       After Hours (After 5p/  First Contact Pager: [phone]  weekends / holidays): Second Contact Pager: [phone]   Chief Complaint: Back pain and chest discomfort  History of Present Illness:  Ms. Boedigheimer is an 86 year old female with CAD s/p CABG in 1999 (LIMA to LAD and left radial to posterior descending artery), type 2 diabetes mellitus, dyslipidemia, essential hypertension, GERD, EPEC followed by continued symptoms of nausea and diarrhea who presents with back pain and chest discomfort for the past 5 days.  The patient states that she has upper back pain that is burning type in nature, intermittently present, alleviated with deep breaths and when she lies down.  She has not used any medication for the pain.  Patient also has chest pain in the substernal region that she describes as "discomfort".  Rates it as 5/10 in intensity and not alleviated with nitroglycerin. Patient states that she had a similar type of symptoms when she had her MI.  Patient had a cath done in 2015 which showed patent vessels, stenosis of RCA.  She was recommended to have aggressive medical therapy at that time.  She has had accompanied headaches on the right frontal aspects traveling down to her right neck, nausea, dyspnea, weakness.  She has been eating less due to nausea.  Denies abdominal pain, dysphagia, odontophagia, numbing.  Of note, the patient was diagnosed with EPEC in November 2020 for which she was given 3-day course of azithromycin.  The patient continued to have nausea, diarrhea, reflux, regurgitation since that time.  She was given  pantoprazole and Pepcid was added later on.  Most recently evaluated by Neil Crouch, [facility] on 12/23/2019 at which time the patient was told to provide a stool study for work-up of postinfectious IBS, microscopic colitis, new infectious gastroenteritis.  She was also scheduled for a EGD due to her continued heartburn/regurgitation requiring chronic PPI use.  In the ED, the patient was afebrile, normotensive, normal pulse and respirations and normal saturation on room air.  She was given morphine, Zofran, 3 doses of nitroglycerin, aspirin.  Meds:   No current facility-administered medications on file prior to encounter.   Current Outpatient Medications on File Prior to Encounter  Medication Sig  . ALPRAZolam (XANAX) 0.25 MG tablet Take 0.25 mg by mouth 2 (two) times daily as needed for anxiety.   Marland Kitchen amLODipine (NORVASC) 5 MG tablet Take 5 mg by mouth at bedtime.  . Ascorbic Acid (VITAMIN C) 500 MG CAPS Take by mouth daily.  Marland Kitchen aspirin EC 81 MG tablet Take 81 mg by mouth daily.  Marland Kitchen atenolol (TENORMIN) 50 MG tablet Take 25 mg by mouth every evening.   Marland Kitchen atorvastatin (LIPITOR) 10 MG tablet Take 10 mg by mouth daily at 6 PM.   . chlorthalidone (HYGROTON) 25 MG tablet Take 12.5 mg by mouth daily.  . cholecalciferol (VITAMIN D) 1000 UNITS tablet Take 1,000 Units by mouth daily.  . Cinnamon 500 MG capsule Take  500 mg by mouth every evening.  . famotidine (PEPCID) 20 MG tablet Take 20 mg by mouth daily.  . isosorbide mononitrate (IMDUR) 30 MG 24 hr tablet Take 1 tablet (30 mg total) by mouth daily.  Marland Kitchen levothyroxine (SYNTHROID, LEVOTHROID) 75 MCG tablet Take 75 mcg by mouth daily before breakfast.  . lisinopril (PRINIVIL,ZESTRIL) 20 MG tablet Take 20 mg by mouth 2 (two) times daily.  . metFORMIN (GLUCOPHAGE) 500 MG tablet Take 500 mg by mouth 2 (two) times daily with a meal.   . Multiple Vitamin (MULTIVITAMIN WITH MINERALS) TABS tablet Take 1 tablet by mouth daily.  . nitroGLYCERIN  (NITROSTAT) 0.4 MG SL tablet PLACE 1 TABLET UNDER THE TONGUE AT ONSET OF CHEST PAIN EVERY 5 MINTUES UP TO 3 TIMES AS NEEDED (Patient taking differently: Place 0.4 mg under the tongue every 5 (five) minutes as needed for chest pain. )  . Omega-3 Fatty Acids (KP OMEGA-3 FISH OIL) 1200 MG CAPS Take 1,200 mg by mouth daily.  . ondansetron (ZOFRAN) 4 MG tablet Take 1/2 to 1 tablet every 8 hours as needed for nausea.  . pantoprazole (PROTONIX) 40 MG tablet Take 40 mg by mouth daily.   . Probiotic Product (PROBIOTIC DAILY PO) Take 1 capsule by mouth at bedtime.  . vitamin B-12 (CYANOCOBALAMIN) 1000 MCG tablet Take 1,000 mcg by mouth daily.   Allergies: Allergies as of 01/04/2020 - Review Complete 01/04/2020  Allergen Reaction Noted  . Ciprofloxacin Diarrhea and Nausea And Vomiting 07/07/2013  . Codeine Other (See Comments) 10/03/2016  . Erythromycin Diarrhea and Nausea And Vomiting 07/07/2013  . Prednisone Nausea And Vomiting 12/22/2014  . Sulfa antibiotics Nausea And Vomiting 10/03/2016   Past Medical History:  Diagnosis Date  . Coronary artery disease    LHC (02/09/14):  Mid LAD occluded, patent circumflex, mid RCA sequential 90%, LIMA-LAD patent radial Y graft to distal RCA patent, EF 60%  . Diabetes mellitus without complication ([place])   . Heart attack ([place]) 1999  . Hypertension   . PONV (postoperative nausea and vomiting)     Family History: *  Hypertension-mother, sister Diabetes mellitus-mother, son Bone cancer-father  Social History:   Lives in [place] with husband who has Parkinson's disease.  The patient was previously able to perform her ADLs without difficulty but over the past few months she has had increased weakness limiting her ADLs.  She has not been able to cook for herself, requiring baths.  She does not use any assistive devices.  Denies tobacco use, EtOH, drug use.  Review of Systems: A complete ROS was negative except as per HPI.   Physical Exam: Blood  pressure (!) 142/80, pulse (!) 59, temperature 97.6 F (36.4 C), temperature source Oral, resp. rate 16, weight 60.3 kg, SpO2 95 %.   Physical Exam  Constitutional: She is oriented to person, place, and time. She appears well-developed and well-nourished. No distress.  Belching during exam  HENT:  Head: Normocephalic and atraumatic.  Eyes: Conjunctivae are normal.  Cardiovascular: Normal rate, regular rhythm, normal heart sounds, intact distal pulses and normal pulses.  Tenderness to palpation   Respiratory: Effort normal and breath sounds normal. No respiratory distress. She has no wheezes.  GI: Soft. Bowel sounds are normal. She exhibits no distension. There is no abdominal tenderness.  Musculoskeletal:        General: No edema.     Comments: 4/5 strength in bilateral upper and lower extremities  Neurological: She is alert and oriented to person, place, and  time. No cranial nerve deficit.  Skin: She is not diaphoretic.  Psychiatric: She has a normal mood and affect. Her behavior is normal. Judgment and thought content normal.   Labs CBC: WBC 8.8, Hb 13.1, HCT 38, PLT 243 BMP: NA 129, K3.9, bicarb 24, CR 1.02, AG 14 Troponin: 5>7 BNP pending Respiratory viral ending  EKG: personally reviewed my interpretation is normal sinus rhythm, no ST changes or T wave changes, left anterior fascicular block  CXR: personally reviewed my interpretation is no significant pulmonary infiltrate, effusion.  Assessment & Plan by Problem: Active Problems:   Chest pain  Ms. Herrada is an 86 year old female with coronary artery disease status post CABG, significant history of chronic PPI use and GERD, recent protracted course of colitis, dyslipidemia, essential hypertension who presents with 5-day history of back pain and chest discomfort.  CAD s/p CABG Back pain with acute chest discomfort Patient with acute chest discomfort over the past 5 days. EKG with new left anterior fascicular block but no  ischemic changes. No acute cardiopulmonary findings on chest xray. Given that the patient has high risk for CAD will complete ischemic workup with trending troponin and doing an echo. Heart score of 4.    Given that the patient's symptoms have been longstanding, can be reciprocated with palpation of chest, troponins are not elevated, and discomfort is not alleviated with nitroglycerin it is less likely secondary to cardiac cause. Patient has significant history of colitis and gerd requiring chronic ppi that makes gi cause more likely.   -gi cocktail  -Continue pantoprazole -trend troponin -continue atorvastatin 10mg  qhs -atenolol 25mg  qhs  -lipid panel in am  -TTE -Cardiology consulted to determine whether needs stress test versus recath -Recommend EGD to evaluate for chronic PPI need.  If stable can do outpatient  Euvolemic Hyponatremia Patient sodium is 129.  Will order studies to determine cause. Given patient's history is likely due to low solute intake.  -BMP every 8 hours -Urine and serum osmolality -Urine sodium  Essential hypertension Patient's blood pressure since admission has ranged 110-160/70-90s.  -Continue amlodipine 5 mg daily -Continue atenolol 25 mg daily -Continue Imdur 30 mg daily -continue lisinopril 20mg  bid -Holding chlorthalidone  Hypothyroidism  -Continue levothyroxine 75 mcg daily  Type 2 diabetes mellitus Do not have a recent A1c on record.  The patient is currently taking metformin 500 mg daily.  -SSI  Dispo: Admit patient to Inpatient with expected length of stay greater than 2 midnights.  SignedLars Mage, MD 01/04/2020, 9:16 AM

## 2020-01-04 NOTE — Procedures (Signed)
Echo attempted. Patient eating. Will attempt again later. 

## 2020-01-04 NOTE — ED Provider Notes (Signed)
[facility] EMERGENCY DEPARTMENT Provider Note   CSN: [id number] Arrival date & time: 01/04/20  0541     History Chief Complaint  Patient presents with  . Chest Pain  . Back Pain    Ariel Rodriguez is a 86 y.o. female with a history of CAD s/p CABG in 1999, T2DM, dyslipidemia, hypertension, & GERD who presents to the ED with complaints of back pain & chest pressure x 5 days. Patient states she has a pain to the middle of her back that "just hurts" with pressure in the anterior chest (does not feel like it goes straight through). Discomfort has been occurring intermittently over the past 5 days lasting variable periods of time, became constant and worse yesterday at 5PM. No specific aggravating factors she can recall, was previously improving with deep breathing or laying down, but this has not helped since yesterday. Received nitroglycerin x 3 without much change, subsequently received morphine with improvement to current 4/10 in severity. She has also had constant nausea and dyspnea with her discomfort. Mentions mild headache similar to prior over the past few days as well as some generalized weakness for several months that seems to be getting worse as well. She states these symptoms feel the same as her prior MI in 1999. Her cardiologist is Dr. Percival Spanish. Given Aspirin PTA. Denies diaphoresis, dizziness, syncope, unilateral leg pain/swelling, hemoptysis, recent surgery/trauma, recent long travel, hormone use, personal hx of cancer, or hx of DVT/PE.    HPI     Past Medical History:  Diagnosis Date  . Coronary artery disease    LHC (02/09/14):  Mid LAD occluded, patent circumflex, mid RCA sequential 90%, LIMA-LAD patent radial Y graft to distal RCA patent, EF 60%  . Diabetes mellitus without complication ([place])   . Heart attack ([place]) 1999  . Hypertension   . PONV (postoperative nausea and vomiting)     Patient Active Problem List   Diagnosis Date Noted  . Chest  pain 01/04/2020  . Nausea without vomiting 12/23/2019  . GERD (gastroesophageal reflux disease) 12/23/2019  . Diarrhea 12/23/2019  . Acute gastroenteritis 04/21/2019  . Syncope 05/07/2018  . Dyslipidemia 03/26/2018  . Heme + stool 10/01/2016  . PONV (postoperative nausea and vomiting) 10/01/2016  . Nausea with vomiting 06/19/2016  . Old MI (myocardial infarction) 02/09/2014  . Coronary atherosclerosis of native coronary artery 02/09/2014  . Hx of CABG 02/09/2014  . Type II or unspecified type diabetes mellitus without mention of complication, not stated as uncontrolled 02/09/2014  . Pure hypercholesterolemia 02/09/2014  . Essential hypertension, benign 02/09/2014  . Unstable angina pectoris ([place]) 02/08/2014    Past Surgical History:  Procedure Laterality Date  . CARDIAC SURGERY    . COLONOSCOPY N/A 10/10/2016   Dr. Gala Romney: single tubular adenoma removed. no future colonoscopies unless develops new symptoms.   Marland Kitchen LAPAROSCOPIC CHOLECYSTECTOMY N/A 05/04/2016  . LEFT HEART CATHETERIZATION WITH CORONARY/GRAFT ANGIOGRAM N/A 02/09/2014   Procedure: LEFT HEART CATHETERIZATION WITH Beatrix Fetters;  Surgeon: Jettie Booze, MD;  Location: [facility] CATH LAB;  Service: Cardiovascular;  Laterality: N/A;  . POLYPECTOMY  10/10/2016   Procedure: POLYPECTOMY;  Surgeon: Daneil Dolin, MD;  Location: [facility] ENDO SUITE;  Service: Endoscopy;;  colon     OB History   No obstetric history on file.     History reviewed. No pertinent family history.  Social History   Tobacco Use  . Smoking status: Never Smoker  . Smokeless tobacco: Never Used  Substance Use Topics  .  Alcohol use: No  . Drug use: No    Home Medications Prior to Admission medications   Medication Sig Start Date End Date Taking? Authorizing Provider  ALPRAZolam (XANAX) 0.25 MG tablet Take 0.25 mg by mouth 2 (two) times daily as needed for anxiety.     [provider]  amLODipine (NORVASC) 5 MG tablet Take 5 mg by  mouth at bedtime.    [provider]  Ascorbic Acid (VITAMIN C) 500 MG CAPS Take by mouth daily.    [provider]  aspirin EC 81 MG tablet Take 81 mg by mouth daily.    [provider]  atenolol (TENORMIN) 50 MG tablet Take 25 mg by mouth every evening.     [provider]  atorvastatin (LIPITOR) 10 MG tablet Take 10 mg by mouth daily at 6 PM.     [provider]  chlorthalidone (HYGROTON) 25 MG tablet Take 12.5 mg by mouth daily.    [provider]  cholecalciferol (VITAMIN D) 1000 UNITS tablet Take 1,000 Units by mouth daily.    [provider]  Cinnamon 500 MG capsule Take 500 mg by mouth every evening.    [provider]  famotidine (PEPCID) 20 MG tablet Take 20 mg by mouth daily.    [provider]  isosorbide mononitrate (IMDUR) 30 MG 24 hr tablet Take 1 tablet (30 mg total) by mouth daily. 05/26/15   Minus Breeding, MD  levothyroxine (SYNTHROID, LEVOTHROID) 75 MCG tablet Take 75 mcg by mouth daily before breakfast.    [provider]  lisinopril (PRINIVIL,ZESTRIL) 20 MG tablet Take 20 mg by mouth 2 (two) times daily.    [provider]  metFORMIN (GLUCOPHAGE) 500 MG tablet Take 500 mg by mouth 2 (two) times daily with a meal.     [provider]  Multiple Vitamin (MULTIVITAMIN WITH MINERALS) TABS tablet Take 1 tablet by mouth daily.    [provider]  nitroGLYCERIN (NITROSTAT) 0.4 MG SL tablet PLACE 1 TABLET UNDER THE TONGUE AT ONSET OF CHEST PAIN EVERY 5 MINTUES UP TO 3 TIMES AS NEEDED Patient taking differently: Place 0.4 mg under the tongue every 5 (five) minutes as needed for chest pain.  07/11/18   Minus Breeding, MD  Omega-3 Fatty Acids (KP OMEGA-3 FISH OIL) 1200 MG CAPS Take 1,200 mg by mouth daily.    [provider]  ondansetron (ZOFRAN) 4 MG tablet Take 1/2 to 1 tablet every 8 hours as needed for nausea. 12/23/19   Mahala Menghini, PA-C  pantoprazole  (PROTONIX) 40 MG tablet Take 40 mg by mouth daily.     [provider]  Probiotic Product (PROBIOTIC DAILY PO) Take 1 capsule by mouth at bedtime.    [provider]  vitamin B-12 (CYANOCOBALAMIN) 1000 MCG tablet Take 1,000 mcg by mouth daily.    [provider]    Allergies    Ciprofloxacin, Codeine, Erythromycin, Prednisone, and Sulfa antibiotics  Review of Systems   Review of Systems Constitutional: Negative for chills and fever.  Eyes: Negative for visual disturbance.  Respiratory: Positive for shortness of breath.  Cardiovascular: Positive for chest pain.  Gastrointestinal: Positive for nausea. Negative for abdominal pain, blood in stool, constipation, diarrhea and vomiting.  Musculoskeletal: Positive for back pain.  Neurological: Positive for weakness (generalized) and headaches. Negative for dizziness, seizures, syncope, speech difficulty and numbness.  All other systems reviewed and are negative.   Physical Exam Updated Vital Signs BP (!) 148/80  Pulse (!) 58   Temp 97.6 F (36.4 C) (Oral)   Resp 13   Wt 60.3 kg   SpO2 90%   BMI 25.98 kg/m   Physical Exam Vitals and nursing note reviewed.  Constitutional:  General: She is not in acute distress. Appearance: She is well-developed. She is not toxic-appearing.  HENT:  Head: Normocephalic and atraumatic.  Eyes:  General:  Right eye: No discharge.  Left eye: No discharge.  Conjunctiva/sclera: Conjunctivae normal.  Cardiovascular:  Rate and Rhythm: Normal rate and regular rhythm.  Pulses:  Radial pulses are 2+ on the right side and 2+ on the left side.  Pulmonary:  Effort: Pulmonary effort is normal. No respiratory distress.  Breath sounds: Normal breath sounds. No wheezing, rhonchi or rales.  Abdominal:  General: There is no distension.  Palpations: Abdomen is soft.  Tenderness: There is no abdominal tenderness.  Musculoskeletal:  Cervical back: Neck supple.  Comments: Trace edema  to the ankles.  Skin:  General: Skin is warm and dry.  Findings: No rash.  Neurological:  General: No focal deficit present.  Mental Status: She is alert.  Comments: Clear speech.  Psychiatric:  Behavior: Behavior normal.   ED Results / Procedures / Treatments   Labs (all labs ordered are listed, but only abnormal results are displayed) Labs Reviewed  BASIC METABOLIC PANEL - Abnormal; Notable for the following components:      Result Value   Sodium 129 (*)    Chloride 91 (*)    Glucose, Bld 143 (*)    Creatinine, Ser 1.02 (*)    GFR calc non Af Amer 49 (*)    GFR calc Af Amer 57 (*)    All other components within normal limits  RESPIRATORY PANEL BY RT PCR (FLU A&B, COVID)  CBC WITH DIFFERENTIAL/PLATELET  BRAIN NATRIURETIC PEPTIDE  TROPONIN I (HIGH SENSITIVITY)  TROPONIN I (HIGH SENSITIVITY)    EKG EKG Interpretation  Date/Time:  Monday January 04 2020 05:50:36 EDT Ventricular Rate:  67 PR Interval:    QRS Duration: 107 QT Interval:  395 QTC Calculation: 417 R Axis:   -54 Text Interpretation: Sinus or ectopic atrial rhythm Left anterior fascicular block Anterior infarct, old Nonspecific T abnormalities, lateral leads ST elevation, consider inferior injury No significant change since last tracing in August 2017 Confirmed by Pryor Curia [id number]) on 01/04/2020 5:53:35 AM   Radiology DG Chest Portable 1 View  Result Date: 01/04/2020 CLINICAL DATA:  Back pain and shortness of breath EXAM: PORTABLE CHEST 1 VIEW COMPARISON:  04/18/2019 FINDINGS: Normal heart size for technique. CABG. There is no edema, consolidation, effusion, or pneumothorax. IMPRESSION: No acute or interval finding. Electronically Signed   By: Monte Fantasia M.D.   On: 01/04/2020 06:27    Procedures Procedures (including critical care time)  Medications Ordered in ED Medications  aspirin EC tablet 81 mg (has no administration in time range)  nitroGLYCERIN (NITROSTAT) SL tablet 0.4 mg (has no  administration in time range)  acetaminophen (TYLENOL) tablet 650 mg (has no administration in time range)  ondansetron (ZOFRAN) injection 4 mg (has no administration in time range)  heparin injection 5,000 Units (has no administration in time range)  nitroGLYCERIN (NITROSTAT) SL tablet 0.4 mg (0.4 mg Sublingual Given 01/04/20 0621)  morphine 2 MG/ML injection 2 mg (2 mg Intravenous Given 01/04/20 0631)  ondansetron (ZOFRAN) injection 4 mg (4 mg Intravenous Given 01/04/20 0631)  morphine 2 MG/ML injection 2 mg (2 mg Intravenous Given 01/04/20 0804)  sodium chloride  0.9 % bolus 500 mL (500 mLs Intravenous Bolus from Bag 01/04/20 0806)    ED Course  I have reviewed the triage vital signs and the nursing notes.  Pertinent labs & imaging results that were available during my care of the patient were reviewed by me and considered in my medical decision making (see chart for details).  Ariel Rodriguez was evaluated in Emergency Department on 01/04/2020 for the symptoms described in the history of present illness. He/she was evaluated in the context of the global COVID-19 pandemic, which necessitated consideration that the patient might be at risk for infection with the SARS-CoV-2 virus that causes COVID-19. Institutional protocols and algorithms that pertain to the evaluation of patients at risk for COVID-19 are in a state of rapid change based on information released by regulatory bodies including the CDC and federal and state organizations. These policies and algorithms were followed during the patient's care in the ED.    MDM Rules/Calculators/A&P                     Patient presents to the ED with complaints of back pain, chest pressure, dyspnea, nausea, & headache for past 5 days also mentions generalized weakness progressing over past several months. She is nontoxic, resting comfortably, vitals WNL on my assessment, BP improved since arrival. Given ASA PTA.   Blood pressure 115/70, pulse 73,  temperature 97.6 F (36.4 C), temperature source Oral, resp. rate 17, weight 60.3 kg, SpO2 97 %.   DDx: ACS, PE, Dissection, pneumothorax, pneumonia, GERD, MSK, arrhythmia, anxiety, anemia, electrolyte derangement., CHF.   Additional history obtained:  Additional history obtained from patient's husband at bedside who confirms above as well as from review of nursing notes & chart.  She was in the hospital in June 2015. She had chest pain ruled out for myocardial infarction. She had patent bypass grafts on cath at that time.   EKG: No significant change from prior, possible new LAFB.   Lab Tests:  Reviewed and interpreted labs, which included:  CBC: No anemia or leukocytosis  BMP: mild electrolyte abnormalities as above with mild increase in creatine-500cc bolus ordered  Initial Troponin: 5   Imaging Studies ordered:  Odered imaging studies which included CXR, I independently visualized and interpreted imaging which showed no acute process.   ED course:  Patient received NTG x 3 and Morphine/zofran by prior provider. On initial assessment pain 4/10 in severity, additional morphine ordered.  07:40: RE-EVAL: Patient currently pain free, states pain seems to be coming and going now.   Low risk wells, not tachycardic/hypoxic- low suspicion for PE.  No significant abnormality in BP, no numbness/weakness, symmetric pulses, no widened mediastinum on CXR, low suspicion for dissection. CXR without pneumonia, fluid overload, or PTX. Patient's EKG is without significant change from prior, initial troponin is 5, she however states this pain feels the same as prior MI leading to CABG. Will consult for medical admission for chest pain obs. Patient is in agreement.   This is a shared visit with supervising physician Dr. Leonides Schanz who has independently evaluated patient & provided guidance in evaluation/management/disposition, in agreement with care   07:55: CONSULT: Discussed case with internal medicine  service, Dr. Maricela Bo, accepts admission, requesting cardiology consult given LAFB.   08:08: CONSULT: Discussed case with Ria Comment with cardiology team- will see patient in consultation.   Portions of this note were generated with [employer]. Dictation errors may occur despite best attempts at proofreading.  Final Clinical  Impression(s) / ED Diagnoses Final diagnoses:  Chest pain, unspecified type    Rx / DC Orders ED Discharge Orders    None       Amaryllis Dyke, PA-C 01/04/20 [place], Delice Bison, DO 01/08/20 2311

## 2020-01-04 NOTE — ED Notes (Signed)
Chest pressure and back pain remain at 5/10 after 3 NTG

## 2020-01-04 NOTE — ED Triage Notes (Signed)
Pt in via EMS w/central mid-sternal chest pressure and L shoulder back pain x 5 days. Pt states this worsened last night at 5pm, it eased off and she was able to go to sleep. Woke up this am, and pressure was still there, along w/HA. States this was similar to her MI back in 73'. Hx of heart cath 6 yrs ago. Also states she has just been feeling weak since her "infection in her intestines x 6 mo's". C/o nausea and mild sob w/exertion

## 2020-01-05 DIAGNOSIS — K219 Gastro-esophageal reflux disease without esophagitis: Secondary | ICD-10-CM | POA: Diagnosis not present

## 2020-01-05 DIAGNOSIS — M549 Dorsalgia, unspecified: Secondary | ICD-10-CM | POA: Diagnosis not present

## 2020-01-05 DIAGNOSIS — R079 Chest pain, unspecified: Secondary | ICD-10-CM | POA: Diagnosis not present

## 2020-01-05 LAB — CBC
HCT: 38.1 % (ref 36.0–46.0)
Hemoglobin: 12.9 g/dL (ref 12.0–15.0)
MCH: 30.5 pg (ref 26.0–34.0)
MCHC: 33.9 g/dL (ref 30.0–36.0)
MCV: 90.1 fL (ref 80.0–100.0)
Platelets: 238 10*3/uL (ref 150–400)
RBC: 4.23 MIL/uL (ref 3.87–5.11)
RDW: 12.6 % (ref 11.5–15.5)
WBC: 9.1 10*3/uL (ref 4.0–10.5)
nRBC: 0 % (ref 0.0–0.2)

## 2020-01-05 LAB — BASIC METABOLIC PANEL
Anion gap: 10 (ref 5–15)
BUN: 16 mg/dL (ref 8–23)
CO2: 25 mmol/L (ref 22–32)
Calcium: 9.6 mg/dL (ref 8.9–10.3)
Chloride: 97 mmol/L — ABNORMAL LOW (ref 98–111)
Creatinine, Ser: 1.02 mg/dL — ABNORMAL HIGH (ref 0.44–1.00)
GFR calc Af Amer: 57 mL/min — ABNORMAL LOW (ref >60)
GFR calc non Af Amer: 49 mL/min — ABNORMAL LOW (ref >60)
Glucose, Bld: 125 mg/dL — ABNORMAL HIGH (ref 70–99)
Potassium: 4 mmol/L (ref 3.5–5.1)
Sodium: 132 mmol/L — ABNORMAL LOW (ref 135–145)

## 2020-01-05 LAB — GLUCOSE, CAPILLARY
Glucose-Capillary: 134 mg/dL — ABNORMAL HIGH (ref 70–99)
Glucose-Capillary: 135 mg/dL — ABNORMAL HIGH (ref 70–99)

## 2020-01-05 LAB — LIPID PANEL
Cholesterol: 136 mg/dL (ref 0–200)
HDL: 42 mg/dL (ref >40)
LDL Cholesterol: 56 mg/dL (ref 0–99)
Total CHOL/HDL Ratio: 3.2 RATIO
Triglycerides: 192 mg/dL — ABNORMAL HIGH (ref <150)
VLDL: 38 mg/dL (ref 0–40)

## 2020-01-05 SURGERY — LEFT HEART CATH AND CORS/GRAFTS ANGIOGRAPHY
Anesthesia: LOCAL

## 2020-01-05 MED ORDER — SODIUM CHLORIDE 0.9 % WEIGHT BASED INFUSION: 1.0000 mL/kg/h | INTRAVENOUS | Status: DC

## 2020-01-05 MED ORDER — SODIUM CHLORIDE 0.9 % IV SOLN: 250.0000 mL | INTRAVENOUS | Status: DC | PRN

## 2020-01-05 MED ORDER — SODIUM CHLORIDE 0.9 % WEIGHT BASED INFUSION
3.0000 mL/kg/h | INTRAVENOUS | Status: DC
  Administered 2020-01-05: 3 mL/kg/h via INTRAVENOUS

## 2020-01-05 MED ORDER — ASPIRIN 81 MG PO CHEW
81.0000 mg | CHEWABLE_TABLET | ORAL | Status: AC
  Administered 2020-01-05: 07:00:00 81 mg via ORAL
  Filled 2020-01-05: qty 1

## 2020-01-05 MED ORDER — SODIUM CHLORIDE 0.9% FLUSH
3.0000 mL | Freq: Two times a day (BID) | INTRAVENOUS | Status: DC
  Administered 2020-01-05: 10:00:00 3 mL via INTRAVENOUS

## 2020-01-05 MED ORDER — SODIUM CHLORIDE 0.9% FLUSH: 3.0000 mL | INTRAVENOUS | Status: DC | PRN

## 2020-01-05 NOTE — Care Management Obs Status (Signed)
[place] NOTIFICATION   Patient Details  Name: Ariel Rodriguez MRN: [id number] Date of Birth: 01-13-1931   Medicare Observation Status Notification Given:  Yes    Dawayne Patricia, RN 01/05/2020, 2:42 PM

## 2020-01-05 NOTE — Plan of Care (Signed)
Discharge to home °

## 2020-01-05 NOTE — Progress Notes (Signed)
Admitted from ED to room 4e01 via stretcher by Tech. Pt is A&OX4, breathing equal and unlabored . Pt denied pain. VS taken; placed on telemetry, POC reviewed with pt. Pt verbalized understanding not to eat or drink in preparation for cardiac cath. NS initiated at 169ml/hr. MAE well.

## 2020-01-05 NOTE — Progress Notes (Addendum)
   Subjective: Pt is an 86 year old woman who was admitted yesterday for chest pain that radiated to her back and nausea. Today, pt denies nausea and chest pain and states she is feeling well overall.  Objective:  Vital signs in last 24 hours: Vitals:   01/05/20 0500 01/05/20 0525 01/05/20 0751 01/05/20 1007  BP: 133/74  133/67 (!) 164/68  Pulse: (!) 58  (!) 59   Resp: 14  15   Temp: 98.4 F (36.9 C)  98 F (36.7 C)   TempSrc: Oral  Oral   SpO2: 98%  95%   Weight:  58.8 kg    Height: 5' (1.524 m) 5' (1.524 m)     General: Pt is well-appearing and in NAD HENT: /AT, grossly EOMI Cardiac: Normal S1, S2. RRR, no murmurs, rubs, or gallops Respiratory: Pt is in no respiratory distress GI: Abdomen is soft and non-tender with normoactive bowel sounds Neuro: No gross focal neurologic deficits noted Extremities: Moving all extremities spontaneously Skin: Warm and dry. No rashes noted. Ecchymoses noted on bilateral arms  Assessment/Plan:  Active Problems:   Chest pain  Assessment and Plan: Ms. Dicken is an 89-yr-old woman who presents to hospital with a 2-day history of chest pain, back pain, and nausea. Hyponatremia was noted on labs.  Chest pain: Chest pain is improved since yesterday. Troponins were negative and EKG was normal. Echo revealed left ventricular hypertrophy and normal left ventricular function with EF of 55-60%. Given negative labs and imaging, chest pain is unlikely to be of cardiac etiology. GERD exacerbation is a more likely diagnosis given presence of nausea and pt's personal history of GERD. -Continue pantoprazole 40 mg QD -Continue aspirin 81 mg QD -Continue plan for outpatient EGD next month -Take Tylenol 650 mg q4h PRN  Nausea: Nausea is improving on anti-emetic medication. Pt denies nausea today. -Continue Maalox/Mylenta 200-200-20 mg/5 mL suspension 30 mL  Hyponatremia: Hyponatremia has improved since admission. Last sodium level was 132. Serum osm is  274, urine osm 570, and urine sodium 110. As pt's nausea and food tolerance improves, hyponatremia will likely normalize. -Continue pantoprazole 40 mg QD -Continue Maalox/Mylenta 200-200-20 mg/5 mL suspension 30 mL  T2DM: -Continue Novolog injection 3 times a day with meals  Hypertension: -Continue amlodipine 5 mg QD -Continue atenolol 25 mg QD -Continue lisinopril 20 mg BID  Hyperlipidemia: -Continue atorvastatin 10 mg QD  Prior to Admission Living Arrangement: Home Anticipated Discharge Location: Home Barriers to Discharge: None Dispo: Anticipated discharge today.   Josepha Pigg, Medical Student 01/05/2020, 11:30 AM Pager: @[email or web address] @

## 2020-01-05 NOTE — Progress Notes (Signed)
Son has made arrangements prior to pt coming into hospital for patient to go into an ALF. Son requesting an FL2 for ALF. Explained to son that it is not our practice to give FL2 when we have not been involved with the placement or pt has not come in from an ALF. FL2 should come from Primary care office. Spoke with AD-Zach Rolena Infante who has given permission for an FL2 to be completed and provided to son to assist in ALF placement - per son he would like the [facility] faxed to Joline Maxcy at [employer]- fax # [phone]- Phoebe Perch has been completed and faxed per request- have explained to son that FL2 still may need additional info from PCP- as there is no d/c summary available yet to assist in the info needed for FL2. - son voiced understanding.

## 2020-01-05 NOTE — Plan of Care (Signed)
Continue to monitor

## 2020-01-05 NOTE — Progress Notes (Signed)
IV's and telemetry discontinued at this time. CCMD notified. Discharge instructions reviewed with patient and patient's son, Merry Proud. All questions answered.   Emelda Fear, RN

## 2020-01-05 NOTE — ED Notes (Signed)
Report given to Rose RN.

## 2020-01-05 NOTE — NC FL2 (Signed)
[place] LEVEL OF CARE SCREENING TOOL     IDENTIFICATION  Patient Name: Ariel Rodriguez Birthdate: 28-Apr-1931 Sex: female Admission Date (Current Location): 01/04/2020  [facility] and [place] Number:  Herbalist and Address:  [facility] [place], [place] [address], [place], [place] [address]      Provider Number: [id number]  Attending Physician Name and Address:  Oda Kilts, MD  Relative Name and Phone Number:       Current Level of Care: Hospital Recommended Level of Care: [place] Prior Approval Number:    Date Approved/Denied:   PASRR Number:    Discharge Plan: Other (Comment)(ALF)    Current Diagnoses: Patient Active Problem List   Diagnosis Date Noted  . Chest pain 01/04/2020  . Nausea without vomiting 12/23/2019  . GERD (gastroesophageal reflux disease) 12/23/2019  . Diarrhea 12/23/2019  . Acute gastroenteritis 04/21/2019  . Syncope 05/07/2018  . Dyslipidemia 03/26/2018  . Heme + stool 10/01/2016  . PONV (postoperative nausea and vomiting) 10/01/2016  . Nausea with vomiting 06/19/2016  . Old MI (myocardial infarction) 02/09/2014  . Coronary atherosclerosis of native coronary artery 02/09/2014  . Hx of CABG 02/09/2014  . Type II or unspecified type diabetes mellitus without mention of complication, not stated as uncontrolled 02/09/2014  . Pure hypercholesterolemia 02/09/2014  . Essential hypertension, benign 02/09/2014  . Unstable angina pectoris ([place]) 02/08/2014    Orientation RESPIRATION BLADDER Height & Weight     Self, Time, Situation, Place  Normal Continent Weight: 58.8 kg Height:  5' (152.4 cm)  BEHAVIORAL SYMPTOMS/MOOD NEUROLOGICAL BOWEL NUTRITION STATUS      Continent Diet  AMBULATORY STATUS COMMUNICATION OF NEEDS Skin   Independent Verbally Surgical wounds(s/p PPM)                       Personal Care Assistance Level of Assistance  Bathing, Dressing Bathing Assistance:  Limited assistance   Dressing Assistance: Limited assistance     Functional Limitations Info  Sight Sight Info: Impaired(glasses)        SPECIAL CARE FACTORS FREQUENCY                       Contractures Contractures Info: Not present    Additional Factors Info  Allergies   Allergies Info: Ciprogloxacin-N/V/D, Codeine- does not tolerate, Erythromycin- N/V/D, Flagyl- can't tolerate, Predisone- N/V, Sulfa antibx- N/V           Current Medications (01/05/2020):  This is the current hospital active medication list Current Facility-Administered Medications  Medication Dose Route Frequency Provider Last Rate Last Admin  . 0.9 %  sodium chloride infusion  250 mL Intravenous PRN Barrett, Rhonda G, PA-C      . acetaminophen (TYLENOL) tablet 650 mg  650 mg Oral Q4H PRN Chundi, Vahini, MD      . alum & mag hydroxide-simeth (MAALOX/MYLANTA) 200-200-20 MG/5ML suspension 30 mL  30 mL Oral Once Barrett, Rhonda G, PA-C      . amLODipine (NORVASC) tablet 5 mg  5 mg Oral QHS Chundi, Vahini, MD   5 mg at 01/04/20 2120  . aspirin EC tablet 81 mg  81 mg Oral Daily Chundi, Vahini, MD   81 mg at 01/05/20 1008  . atenolol (TENORMIN) tablet 25 mg  25 mg Oral QPM Chundi, Vahini, MD   25 mg at 01/04/20 2121  . atorvastatin (LIPITOR) tablet 10 mg  10 mg Oral q1800 Chundi, Vahini,  MD   10 mg at 01/04/20 2120  . heparin injection 5,000 Units  5,000 Units Subcutaneous Q8H Oda Kilts, MD   5,000 Units at 01/05/20 1412  . insulin aspart (novoLOG) injection 0-9 Units  0-9 Units Subcutaneous TID WC Chundi, Vahini, MD   1 Units at 01/05/20 1115  . isosorbide mononitrate (IMDUR) 24 hr tablet 30 mg  30 mg Oral Daily Barrett, Rhonda G, PA-C   30 mg at 01/05/20 1007  . levothyroxine (SYNTHROID) tablet 75 mcg  75 mcg Oral QAC breakfast Chundi, Verne Spurr, MD   75 mcg at 01/05/20 0630  . lisinopril (ZESTRIL) tablet 20 mg  20 mg Oral BID Chundi, Vahini, MD   20 mg at 01/05/20 1007  . nitroGLYCERIN  (NITROSTAT) SL tablet 0.4 mg  0.4 mg Sublingual Q5 Min x 3 PRN Chundi, Vahini, MD      . ondansetron (ZOFRAN) injection 4 mg  4 mg Intravenous Q6H PRN Chundi, Vahini, MD      . pantoprazole (PROTONIX) EC tablet 40 mg  40 mg Oral Daily Chundi, Vahini, MD   40 mg at 01/05/20 1008  . sodium chloride flush (NS) 0.9 % injection 3 mL  3 mL Intravenous Q12H Barrett, Rhonda G, PA-C   3 mL at 01/05/20 1008  . sodium chloride flush (NS) 0.9 % injection 3 mL  3 mL Intravenous PRN Barrett, Evelene Croon, PA-C         Discharge Medications: Please see discharge summary for a list of discharge medications.  Relevant Imaging Results:  Relevant Lab Results:   Additional Information SSN#- [id number]  Dawayne Patricia, RN

## 2020-01-05 NOTE — Care Management CC44 (Signed)
Condition Code 44 Documentation Completed  Patient Details  Name: SHATORI LIBERA MRN: [id number] Date of Birth: [birth year]   Condition Code 44 given:  Yes Patient signature on Condition Code 44 notice:  Yes Documentation of 2 MD's agreement:  Yes Code 44 added to claim:  Yes    Dawayne Patricia, RN 01/05/2020, 2:42 PM

## 2020-01-05 NOTE — Progress Notes (Signed)
Progress Note  Patient Name: Ariel Rodriguez Date of Encounter: 01/05/2020  Primary Cardiologist:   Minus Breeding, MD   Subjective   Back pain is improved.  No acute complaints.   Inpatient Medications    Scheduled Meds: . alum & mag hydroxide-simeth  30 mL Oral Once  . amLODipine  5 mg Oral QHS  . aspirin EC  81 mg Oral Daily  . atenolol  25 mg Oral QPM  . atorvastatin  10 mg Oral q1800  . heparin injection (subcutaneous)  5,000 Units Subcutaneous Q8H  . insulin aspart  0-9 Units Subcutaneous TID WC  . isosorbide mononitrate  30 mg Oral Daily  . levothyroxine  75 mcg Oral QAC breakfast  . lisinopril  20 mg Oral BID  . pantoprazole  40 mg Oral Daily  . sodium chloride flush  3 mL Intravenous Q12H   Continuous Infusions: . sodium chloride    . sodium chloride     PRN Meds: sodium chloride, acetaminophen, nitroGLYCERIN, ondansetron (ZOFRAN) IV, sodium chloride flush   Vital Signs    Vitals:   01/05/20 0400 01/05/20 0500 01/05/20 0525 01/05/20 0751  BP: 136/66 133/74  133/67  Pulse: (!) 56 (!) 58  (!) 59  Resp: 14 14  15   Temp:  98.4 F (36.9 C)  98 F (36.7 C)  TempSrc:  Oral  Oral  SpO2: 92% 98%  95%  Weight:   58.8 kg   Height:  5' (1.524 m) 5' (1.524 m)     Intake/Output Summary (Last 24 hours) at 01/05/2020 0810 Last data filed at 01/04/2020 1025 Gross per 24 hour  Intake 500 ml  Output --  Net 500 ml   Filed Weights   01/04/20 0556 01/05/20 0525  Weight: 60.3 kg 58.8 kg    Telemetry    NSR - Personally Reviewed  ECG    NA - Personally Reviewed  Physical Exam   GEN: No acute distress.   Neck: No  JVD Cardiac: RRR, no murmurs, rubs, or gallops.  Respiratory: Clear  to auscultation bilaterally. GI: Soft, nontender, non-distended  MS: No  edema; No deformity. Neuro:  Nonfocal  Psych: Normal affect   Labs    Chemistry Recent Labs  Lab 01/04/20 1103 01/04/20 2140 01/05/20 0439  NA 129* 129* 132*  K 3.9 3.9 4.0  CL 93* 92*  97*  CO2 24 26 25   GLUCOSE 182* 145* 125*  BUN 13 18 16   CREATININE 0.96 1.13* 1.02*  CALCIUM 9.2 9.2 9.6  GFRNONAA 53* 44* 49*  GFRAA >60 51* 57*  ANIONGAP 12 11 10      Hematology Recent Labs  Lab 01/04/20 0600 01/05/20 0439  WBC 8.8 9.1  RBC 4.25 4.23  HGB 13.1 12.9  HCT 38.0 38.1  MCV 89.4 90.1  MCH 30.8 30.5  MCHC 34.5 33.9  RDW 12.3 12.6  PLT 243 238    Cardiac EnzymesNo results for input(s): TROPONINI in the last 168 hours. No results for input(s): TROPIPOC in the last 168 hours.   BNP Recent Labs  Lab 01/04/20 0600  BNP 67.1     DDimer No results for input(s): DDIMER in the last 168 hours.   Radiology    DG Chest Portable 1 View  Result Date: 01/04/2020 CLINICAL DATA:  Back pain and shortness of breath EXAM: PORTABLE CHEST 1 VIEW COMPARISON:  04/18/2019 FINDINGS: Normal heart size for technique. CABG. There is no edema, consolidation, effusion, or pneumothorax. IMPRESSION: No acute or interval finding.  Electronically Signed   By: Monte Fantasia M.D.   On: 01/04/2020 06:27   ECHOCARDIOGRAM COMPLETE  Result Date: 01/04/2020    ECHOCARDIOGRAM REPORT   Patient Name:   Ariel Rodriguez Date of Exam: 01/04/2020 Medical Rec #:  [id number]          Height:       60.0 in Accession #:    [id number]         Weight:       133.0 lb Date of Birth:  May 27, 1931           BSA:          1.569 m Patient Age:    86 years           BP:           137/62 mmHg Patient Gender: F                  HR:           64 bpm. Exam Location:  Inpatient Procedure: 2D Echo, Intracardiac Opacification Agent, Cardiac Doppler and Color            Doppler Indications:    Chest pain 786.50/R07.9  History:        Patient has prior history of Echocardiogram examinations, most                 recent 07/22/2014. Angina, CAD and Previous Myocardial                 Infarction; Risk Factors:Dyslipidemia.  Sonographer:    Clayton Lefort RDCS (AE) Referring Phys: [place]  1. Left ventricular  ejection fraction, by estimation, is 55 to 60%. The left ventricle has normal function. The left ventricle has no regional wall motion abnormalities. There is mild left ventricular hypertrophy. Left ventricular diastolic parameters are consistent with Grade I diastolic dysfunction (impaired relaxation).  2. Right ventricular systolic function is mildly reduced. The right ventricular size is mildly enlarged. There is normal pulmonary artery systolic pressure. The estimated right ventricular systolic pressure is 99991111 mmHg.  3. The mitral valve is normal in structure. Trivial mitral valve regurgitation. No evidence of mitral stenosis.  4. The aortic valve is tricuspid. Aortic valve regurgitation is not visualized. Mild aortic valve sclerosis is present, with no evidence of aortic valve stenosis.  5. The inferior vena cava is normal in size with <50% respiratory variability, suggesting right atrial pressure of 8 mmHg. FINDINGS  Left Ventricle: Left ventricular ejection fraction, by estimation, is 55 to 60%. The left ventricle has normal function. The left ventricle has no regional wall motion abnormalities. Definity contrast agent was given IV to delineate the left ventricular  endocardial borders. The left ventricular internal cavity size was normal in size. There is mild left ventricular hypertrophy. Abnormal (paradoxical) septal motion, consistent with left bundle branch block. Left ventricular diastolic parameters are consistent with Grade I diastolic dysfunction (impaired relaxation). Right Ventricle: The right ventricular size is mildly enlarged. No increase in right ventricular wall thickness. Right ventricular systolic function is mildly reduced. There is normal pulmonary artery systolic pressure. The tricuspid regurgitant velocity  is 2.44 m/s, and with an assumed right atrial pressure of 8 mmHg, the estimated right ventricular systolic pressure is 99991111 mmHg. Left Atrium: Left atrial size was normal in size.  Right Atrium: Right atrial size was normal in size. Pericardium: Trivial pericardial effusion is present. Mitral Valve: The mitral valve is normal in  structure. Normal mobility of the mitral valve leaflets. Trivial mitral valve regurgitation. No evidence of mitral valve stenosis. MV peak gradient, 5.3 mmHg. The mean mitral valve gradient is 1.0 mmHg. Tricuspid Valve: The tricuspid valve is normal in structure. Tricuspid valve regurgitation is trivial. No evidence of tricuspid stenosis. Aortic Valve: The aortic valve is tricuspid. . There is mild thickening and mild calcification of the aortic valve. Aortic valve regurgitation is not visualized. Mild aortic valve sclerosis is present, with no evidence of aortic valve stenosis. There is mild thickening of the aortic valve. There is mild calcification of the aortic valve. Aortic valve mean gradient measures 5.0 mmHg. Aortic valve peak gradient measures 9.0 mmHg. Aortic valve area, by VTI measures 1.54 cm. Pulmonic Valve: The pulmonic valve was grossly normal. Pulmonic valve regurgitation is not visualized. No evidence of pulmonic stenosis. Aorta: The aortic root and ascending aorta are structurally normal, with no evidence of dilitation. Venous: The inferior vena cava is normal in size with less than 50% respiratory variability, suggesting right atrial pressure of 8 mmHg. IAS/Shunts: The interatrial septum was not well visualized.  LEFT VENTRICLE PLAX 2D LVIDd:         3.50 cm  Diastology LVIDs:         2.58 cm  LV e' lateral:   6.31 cm/s LV PW:         1.17 cm  LV E/e' lateral: 8.4 LV IVS:        1.30 cm  LV e' medial:    3.81 cm/s LVOT diam:     1.80 cm  LV E/e' medial:  14.0 LV SV:         44 LV SV Index:   28 LVOT Area:     2.54 cm  RIGHT VENTRICLE             IVC RV Basal diam:  2.40 cm     IVC diam: 0.90 cm RV S prime:     14.85 cm/s TAPSE (M-mode): 1.8 cm LEFT ATRIUM             Index       RIGHT ATRIUM           Index LA diam:        2.50 cm 1.59 cm/m  RA  Area:     13.00 cm LA Vol (A2C):   42.1 ml 26.82 ml/m RA Volume:   29.80 ml  18.99 ml/m LA Vol (A4C):   35.7 ml 22.75 ml/m LA Biplane Vol: 40.9 ml 26.06 ml/m  AORTIC VALVE AV Area (Vmax):    1.67 cm AV Area (Vmean):   1.61 cm AV Area (VTI):     1.54 cm AV Vmax:           150.00 cm/s AV Vmean:          104.000 cm/s AV VTI:            0.285 m AV Peak Grad:      9.0 mmHg AV Mean Grad:      5.0 mmHg LVOT Vmax:         98.20 cm/s LVOT Vmean:        66.000 cm/s LVOT VTI:          0.172 m LVOT/AV VTI ratio: 0.60  AORTA Ao Root diam: 3.20 cm Ao Asc diam:  3.30 cm MITRAL VALVE               TRICUSPID VALVE MV Area (PHT):  3.54 cm    TR Peak grad:   23.8 mmHg MV Peak grad:  5.3 mmHg    TR Vmax:        244.00 cm/s MV Mean grad:  1.0 mmHg MV Vmax:       1.15 m/s    SHUNTS MV Vmean:      49.2 cm/s   Systemic VTI:  0.17 m MV Decel Time: 214 msec    Systemic Diam: 1.80 cm MV E velocity: 53.30 cm/s MV A velocity: 89.00 cm/s MV E/A ratio:  0.60 Cherlynn Kaiser MD Electronically signed by Cherlynn Kaiser MD Signature Date/Time: 01/04/2020/11:56:38 PM    Final     Cardiac Studies   01/04/20  Echo:  1. Left ventricular ejection fraction, by estimation, is 55 to 60%. The  left ventricle has normal function. The left ventricle has no regional  wall motion abnormalities. There is mild left ventricular hypertrophy.  Left ventricular diastolic parameters  are consistent with Grade I diastolic dysfunction (impaired relaxation).  2. Right ventricular systolic function is mildly reduced. The right  ventricular size is mildly enlarged. There is normal pulmonary artery  systolic pressure. The estimated right ventricular systolic pressure is  99991111 mmHg.  3. The mitral valve is normal in structure. Trivial mitral valve  regurgitation. No evidence of mitral stenosis.  4. The aortic valve is tricuspid. Aortic valve regurgitation is not  visualized. Mild aortic valve sclerosis is present, with no evidence of  aortic  valve stenosis.  5. The inferior vena cava is normal in size with <50% respiratory  variability, suggesting right atrial pressure of 8 mmHg.   Patient Profile     86 y.o. female with a hx of CABG 1999, cath 2015 w/ patent grafts, DM, HTN, HLD, who is being seen for the evaluation of chest pain at the request of Dr Rebeca Alert.  Assessment & Plan    CHEST PAIN:  No objective evidence of ischemia.  Trop neg x 4.  No acute EKG changes.  NTG IV stopped yesterday. I am not suggesting further cardiac testing at this time.   DM:  A1C not at target.  I will defer to the primary team to consider SGLT2i or GLP1ra given her cardiac disease.    DYSLIPIDEMIA:  LDL at target.  No change in therapy.  HTN:  BP is somewhat labile but has been controlled at home.  Continue current therapy.    For questions or updates, please contact [place] Please consult [email or web address] for contact info under Cardiology/STEMI.   Signed, Minus Breeding, MD  01/05/2020, 8:10 AM

## 2020-01-05 NOTE — Discharge Summary (Addendum)
Name: Ariel Rodriguez MRN: [id number] DOB: 16-Aug-1931 86 y.o. PCP: Glenda Chroman, MD  Date of Admission: 01/04/2020  5:41 AM Date of Discharge:  01/05/2020 Attending Physician: Oda Kilts, MD  Discharge Diagnosis: 1. GERD  Discharge Medications: Allergies as of 01/05/2020       Reactions   Ciprofloxacin Diarrhea, Nausea And Vomiting   Codeine Other (See Comments)   Pt went to hospital for flu and states that kept getting worse with med.    Erythromycin Diarrhea, Nausea And Vomiting   Flagyl [metronidazole] Other (See Comments)   Patient can't tolerate   Prednisone Nausea And Vomiting   Sulfa Antibiotics Nausea And Vomiting        Medication List     TAKE these medications    ALPRAZolam 0.25 MG tablet Commonly known as: XANAX Take 0.25 mg by mouth 2 (two) times daily as needed for anxiety.   amLODipine 5 MG tablet Commonly known as: NORVASC Take 5 mg by mouth at bedtime.   aspirin EC 81 MG tablet Take 81 mg by mouth daily.   atenolol 50 MG tablet Commonly known as: TENORMIN Take 25 mg by mouth every evening.   atorvastatin 10 MG tablet Commonly known as: LIPITOR Take 10 mg by mouth daily at 6 PM.   chlorthalidone 25 MG tablet Commonly known as: HYGROTON Take 12.5 mg by mouth daily.   cholecalciferol 1000 units tablet Commonly known as: VITAMIN D Take 1,000 Units by mouth daily.   Cinnamon 500 MG capsule Take 500 mg by mouth every evening.   famotidine 20 MG tablet Commonly known as: PEPCID Take 20 mg by mouth daily.   isosorbide mononitrate 30 MG 24 hr tablet Commonly known as: Imdur Take 1 tablet (30 mg total) by mouth daily.   KP Omega-3 Fish Oil 1200 MG Caps Take 1,200 mg by mouth daily.   levothyroxine 75 MCG tablet Commonly known as: SYNTHROID Take 75 mcg by mouth daily before breakfast.   lisinopril 20 MG tablet Commonly known as: ZESTRIL Take 20 mg by mouth 2 (two) times daily.   metFORMIN 500 MG 24 hr tablet Commonly  known as: GLUCOPHAGE-XR Take 500 mg by mouth daily.   multivitamin with minerals Tabs tablet Take 1 tablet by mouth daily.   nitroGLYCERIN 0.4 MG SL tablet Commonly known as: NITROSTAT PLACE 1 TABLET UNDER THE TONGUE AT ONSET OF CHEST PAIN EVERY 5 MINTUES UP TO 3 TIMES AS NEEDED What changed: See the new instructions.   ondansetron 4 MG tablet Commonly known as: ZOFRAN Take 1/2 to 1 tablet every 8 hours as needed for nausea. What changed:  how much to take how to take this when to take this reasons to take this   pantoprazole 40 MG tablet Commonly known as: PROTONIX Take 40 mg by mouth daily.   PROBIOTIC DAILY PO Take 1 capsule by mouth at bedtime.   psyllium 58.6 % packet Commonly known as: METAMUCIL Take 1 packet by mouth daily.   triamcinolone cream 0.1 % Commonly known as: KENALOG Apply 1 application topically 2 (two) times daily as needed for rash.   vitamin B-12 1000 MCG tablet Commonly known as: CYANOCOBALAMIN Take 1,000 mcg by mouth daily.   Vitamin C 500 MG Caps Take by mouth daily.       Disposition and follow-up:   Ms.Ariel Rodriguez was discharged from [facility] in [place] condition.  At the hospital follow up visit please address:  1.  GERD: Patient  initially presented with chest pain mid scapular discomfort with concerns for ACS, given her extensive cardiac history.  EKG demonstrated new left anterior fascicular block but had no ischemic changes. Extensive ischemic work-up including troponin trending did not show evidence of MI.  Echocardiogram showed EF 55-60% w/ no wall motion abnormalities. Patient symptoms markedly improved with a GI cocktail, suggesting her chronic GERD exacerbation was likely cause of discomfort. -EGD with outpatient GI scheduled for 5/25 with Dr. Gala Romney  2.  Labs / imaging needed at time of follow-up: BMP  3.  Pending labs/ test needing follow-up: None  Follow-up Appointments:   Hospital Course by problem  list: 1. GERD  In summary, Ms. Ariel Rodriguez is an 86 year old female with a past medical history significant for CAD s/p CABG in 1999 (LIMA to LAD and left radial to posterior descending artery), T2DM, dyslipidemia, HTN, and GERD who presented to the hospital with chest pain and back pain for 5 days prior to admission.  She described the pain as a burning sensation that is alleviated with deep breaths and lying down.  The severity was 5/10 and was not relieved by nitroglycerin, however she was nervous that this could potentially have been another MI, because she felt similar when she had one in 2015.  An EKG was performed and did not demonstrate any acute changes.  Serial troponins were obtained and were not elevated.  Echocardiogram demonstrated normal EF with no wall motion abnormalities or valvular disease.  The patient received a GI cocktail and her symptoms were markedly improved.  The patient is followed by GI in the outpatient setting, and previously scheduled an EGD to evaluate ongoing dyspepsia on 5/25 with Dr. Gala Romney.  The patient was reassured that her discomfort was likely not cardiac in origin, and she will follow up with her GI doctor in the outpatient setting.  Discharge Vitals:   BP (!) 164/68   Pulse (!) 59   Temp 98 F (36.7 C) (Oral)   Resp 15   Ht 5' (1.524 m)   Wt 58.8 kg   SpO2 95%   BMI 25.31 kg/m   Pertinent Labs, Studies, and Procedures:   CBC Latest Ref Rng & Units 01/05/2020 01/04/2020 06/11/2019  WBC 4.0 - 10.5 K/uL 9.1 8.8 10.7(H)  Hemoglobin 12.0 - 15.0 g/dL 12.9 13.1 12.6  Hematocrit 36.0 - 46.0 % 38.1 38.0 38.2  Platelets 150 - 400 K/uL 238 243 196   BMP Latest Ref Rng & Units 01/05/2020 01/04/2020 01/04/2020  Glucose 70 - 99 mg/dL 125(H) 145(H) 182(H)  BUN 8 - 23 mg/dL 16 18 13   Creatinine 0.44 - 1.00 mg/dL 1.02(H) 1.13(H) 0.96  Sodium 135 - 145 mmol/L 132(L) 129(L) 129(L)  Potassium 3.5 - 5.1 mmol/L 4.0 3.9 3.9  Chloride 98 - 111 mmol/L 97(L) 92(L) 93(L)  CO2 22  - 32 mmol/L 25 26 24   Calcium 8.9 - 10.3 mg/dL 9.6 9.2 9.2   Lipid Panel Cholesterol 0 - 200 mg/dL 136  149   Triglycerides <150 mg/dL 192High   208High    HDL >40 mg/dL 42  44 R   Total CHOL/HDL Ratio RATIO 3.2  3.4   VLDL 0 - 40 mg/dL 38  42High    LDL Cholesterol 0 - 99 mg/dL 56  63 CM    Urine Osmolality, Ur 300 - 900 mOsm/kg 570    Sodium, Ur mmol/L 110    Serum Osmolality 275 - 295 mOsm/kg 274Low     Echocardiogram: 1. Left ventricular ejection  fraction, by estimation, is 55 to 60%. The left ventricle has normal function. The left ventricle has no regional wall motion abnormalities. There is mild left ventricular hypertrophy. Left ventricular diastolic parameters are consistent with Grade I diastolic dysfunction (impaired relaxation). 2. Right ventricular systolic function is mildly reduced. The right ventricular size is mildly enlarged. There is normal pulmonary artery systolic pressure. The estimated right ventricular systolic pressure is 99991111 mmHg. 3. The mitral valve is normal in structure. Trivial mitral valve regurgitation. No evidence of mitral stenosis. 4. The aortic valve is tricuspid. Aortic valve regurgitation is not visualized. Mild aortic valve sclerosis is present, with no evidence of aortic valve stenosis. 5. The inferior vena cava is normal in size with <50% respiratory variability, suggesting right atrial pressure of 8 mmHg.  CXR: IMPRESSION: No acute or interval finding.  Discharge Instructions: Discharge Instructions     Diet - low sodium heart healthy   Complete by: As directed    Discharge instructions   Complete by: As directed    Thank you for allowing [place] to take care of you during your hospitalization.  Below is a summary of what we treated:  1.  Chest pain -We do not think your chest pain is due to heart issues.  We think it is likely due to your GERD.  2.  Follow-up -Please follow-up with your GI doctor with outpatient endoscopy to  further evaluate the chest pain.   Increase activity slowly   Complete by: As directed       Signed: Earlene Plater, MD Internal Medicine, PGY1 Pager: [phone]  01/08/2020,3:56 PM

## 2020-01-05 NOTE — Plan of Care (Signed)
  Problem: Clinical Measurements: Goal: Ability to maintain clinical measurements within normal limits will improve Outcome: Progressing   Problem: Clinical Measurements: Goal: Respiratory complications will improve Outcome: Progressing   Problem: Clinical Measurements: Goal: Cardiovascular complication will be avoided Outcome: Progressing   

## 2020-01-13 ENCOUNTER — Ambulatory Visit: Payer: Medicare Other | Admitting: Cardiology

## 2020-01-13 DIAGNOSIS — R197 Diarrhea, unspecified: Secondary | ICD-10-CM | POA: Diagnosis not present

## 2020-01-13 DIAGNOSIS — I10 Essential (primary) hypertension: Secondary | ICD-10-CM | POA: Diagnosis not present

## 2020-01-13 DIAGNOSIS — E119 Type 2 diabetes mellitus without complications: Secondary | ICD-10-CM | POA: Diagnosis not present

## 2020-01-13 DIAGNOSIS — R079 Chest pain, unspecified: Secondary | ICD-10-CM | POA: Diagnosis not present

## 2020-01-13 DIAGNOSIS — R110 Nausea: Secondary | ICD-10-CM | POA: Diagnosis not present

## 2020-01-13 DIAGNOSIS — I252 Old myocardial infarction: Secondary | ICD-10-CM | POA: Diagnosis not present

## 2020-01-13 DIAGNOSIS — K529 Noninfective gastroenteritis and colitis, unspecified: Secondary | ICD-10-CM | POA: Diagnosis not present

## 2020-01-13 DIAGNOSIS — K219 Gastro-esophageal reflux disease without esophagitis: Secondary | ICD-10-CM | POA: Diagnosis not present

## 2020-01-13 DIAGNOSIS — E782 Mixed hyperlipidemia: Secondary | ICD-10-CM | POA: Diagnosis not present

## 2020-01-13 DIAGNOSIS — I2510 Atherosclerotic heart disease of native coronary artery without angina pectoris: Secondary | ICD-10-CM | POA: Diagnosis not present

## 2020-01-13 DIAGNOSIS — R55 Syncope and collapse: Secondary | ICD-10-CM | POA: Diagnosis not present

## 2020-01-13 DIAGNOSIS — E039 Hypothyroidism, unspecified: Secondary | ICD-10-CM | POA: Diagnosis not present

## 2020-01-19 ENCOUNTER — Telehealth: Payer: Self-pay

## 2020-01-19 NOTE — Telephone Encounter (Signed)
-----   Message from Josue Hector sent at 01/19/2020  3:40 PM EDT ----- Marykay Lex,  I have tried to call this patient to let her know that the Covid testing is being done at [facility] but the only # listed says it is not in service.  She is scheduled for 5/21 @ 2:15.  Can you send her a letter and let her know or if she tries to contact you please let her know to come to [facility].  Thanks, Hoyle Sauer

## 2020-01-19 NOTE — Telephone Encounter (Signed)
Letter mailed to pt.  

## 2020-01-20 ENCOUNTER — Telehealth: Payer: Self-pay | Admitting: Internal Medicine

## 2020-01-20 DIAGNOSIS — I10 Essential (primary) hypertension: Secondary | ICD-10-CM | POA: Diagnosis not present

## 2020-01-20 DIAGNOSIS — F419 Anxiety disorder, unspecified: Secondary | ICD-10-CM | POA: Diagnosis not present

## 2020-01-20 NOTE — Telephone Encounter (Signed)
[phone]   Patient daughter in law, terry [phone], called to cancel patient procedure.  Has moved to another town

## 2020-01-20 NOTE — Telephone Encounter (Signed)
noted 

## 2020-01-20 NOTE — Telephone Encounter (Signed)
Called endo and LMOVM to cancel. FYI to LSL

## 2020-01-27 DIAGNOSIS — R110 Nausea: Secondary | ICD-10-CM | POA: Diagnosis not present

## 2020-01-29 ENCOUNTER — Other Ambulatory Visit (HOSPITAL_COMMUNITY): Payer: PPO

## 2020-02-02 ENCOUNTER — Ambulatory Visit (HOSPITAL_COMMUNITY): Admit: 2020-02-02 | Payer: PPO | Admitting: Internal Medicine

## 2020-02-02 ENCOUNTER — Encounter (HOSPITAL_COMMUNITY): Payer: Self-pay

## 2020-02-02 SURGERY — EGD (ESOPHAGOGASTRODUODENOSCOPY)
Anesthesia: Moderate Sedation

## 2020-02-04 DIAGNOSIS — I10 Essential (primary) hypertension: Secondary | ICD-10-CM | POA: Diagnosis not present

## 2020-02-04 DIAGNOSIS — E559 Vitamin D deficiency, unspecified: Secondary | ICD-10-CM | POA: Diagnosis not present

## 2020-02-04 DIAGNOSIS — E039 Hypothyroidism, unspecified: Secondary | ICD-10-CM | POA: Diagnosis not present

## 2020-02-04 DIAGNOSIS — E539 Vitamin B deficiency, unspecified: Secondary | ICD-10-CM | POA: Diagnosis not present

## 2021-01-03 ENCOUNTER — Encounter: Payer: Self-pay | Admitting: Cardiology

## 2021-06-03 IMAGING — CT CT HEAD W/O CM
4 series · 16 of 47 positions shown, 18 images · non-contrast
Comparison: None.

CLINICAL DATA: EMS was called because pt was dizzy-- had an episode
of unresponsiveness, husband was able to [place] her up, went inside,
but remained dizzy. No slurred speech, moves all extremities, denies
any chest pain-

EXAM:
CT HEAD WITHOUT CONTRAST
TECHNIQUE: Contiguous axial images were obtained from the base of the skull
through the vertex without intravenous contrast.

[Series 2: head without · axial · non-contrast · 0.41mm/px · z∈[-55,+65]mm · 7 of 32 slices shown, 9 images]
[im 4/32  brain]
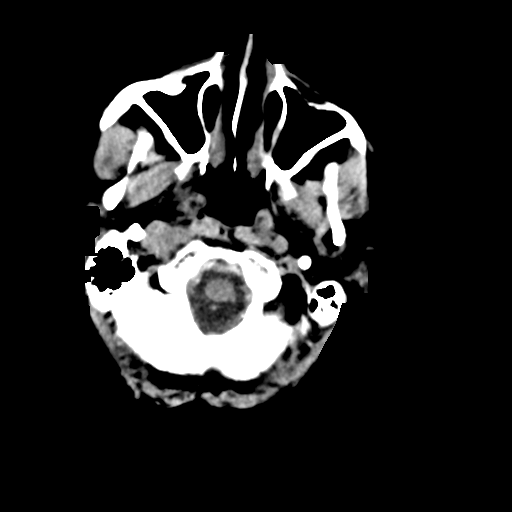
[im 4/32  bone]
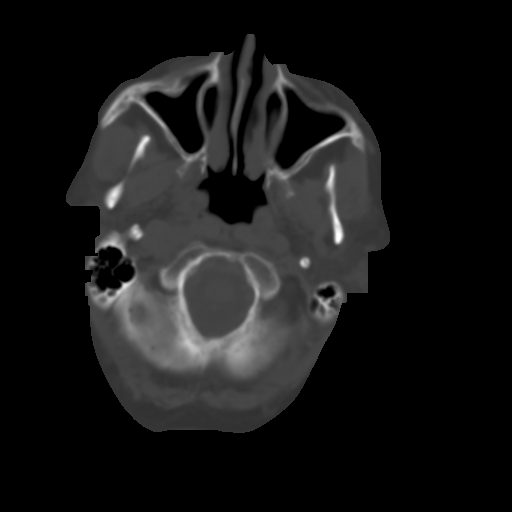
[im 8/32  brain]
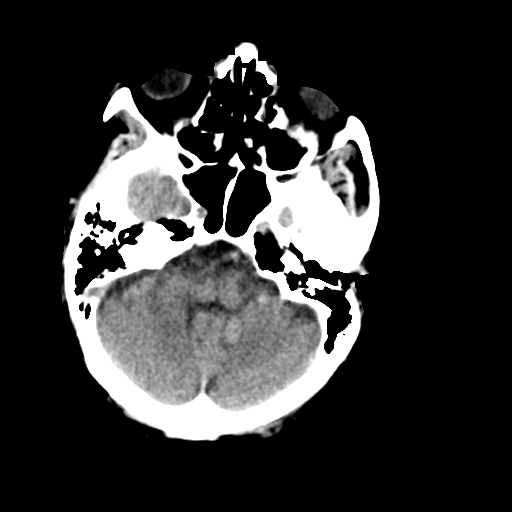
[im 12/32  brain]
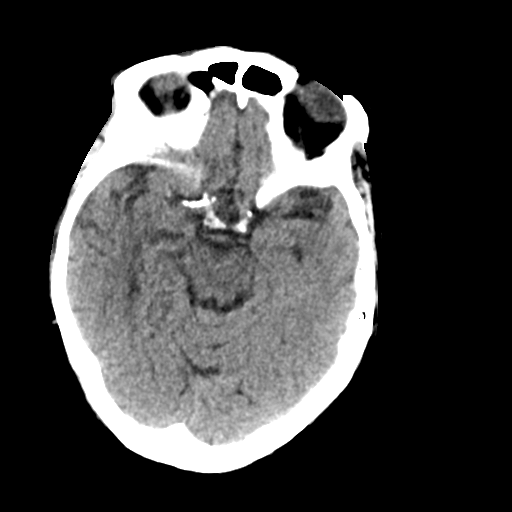
[im 16/32  brain]
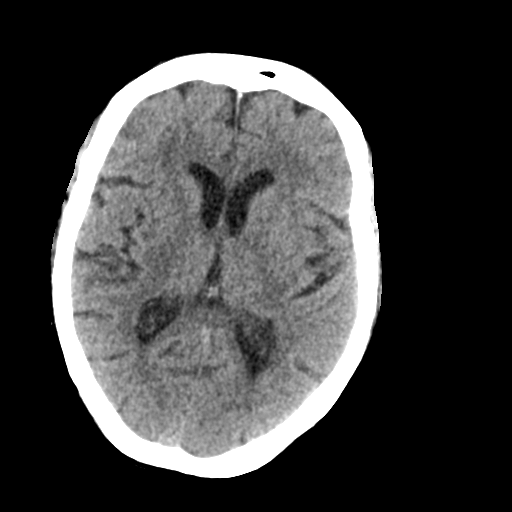
[im 20/32  brain]
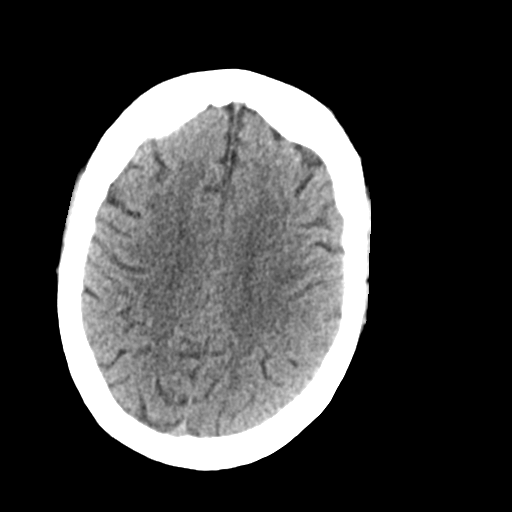
[im 20/32  bone]
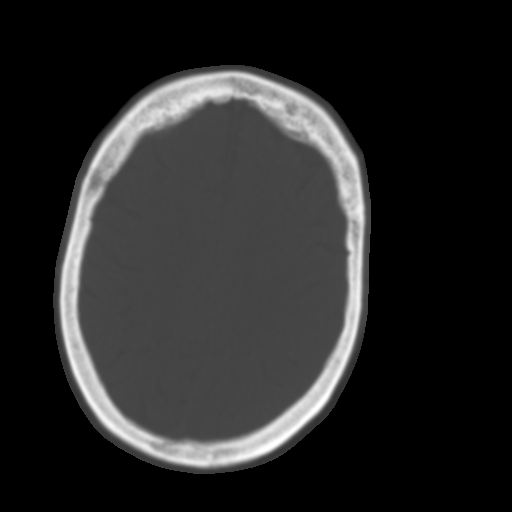
[im 24/32  brain]
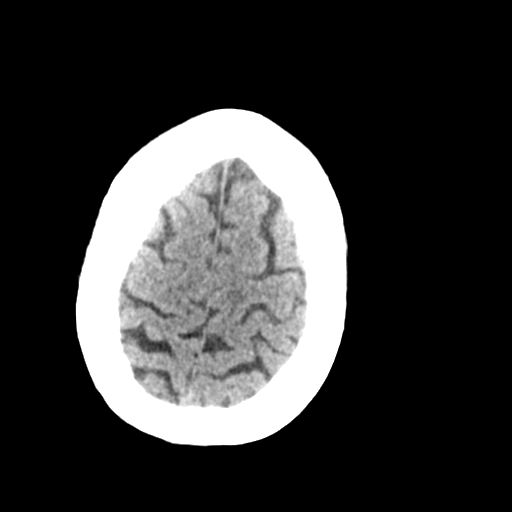
[im 28/32  brain]
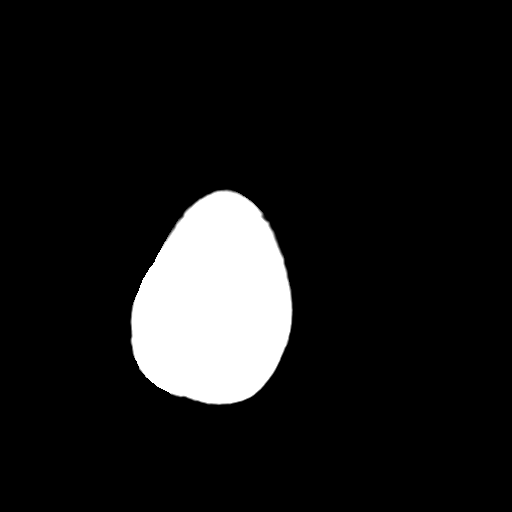

[Series 3: head bone · axial · 0.41mm/px · z∈[-56,-24]mm · 3 of 80 slices shown]
[im 8/80  bone]
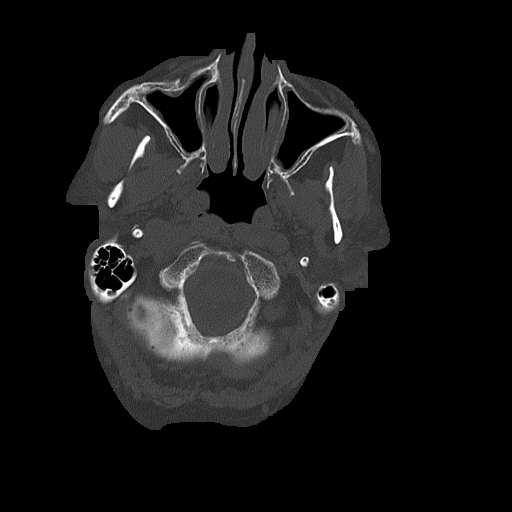
[im 16/80  bone]
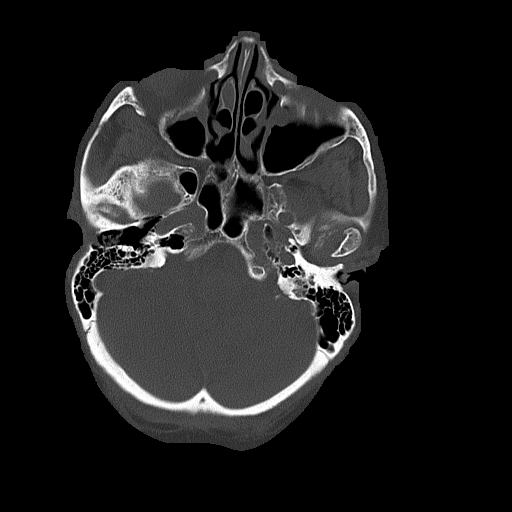
[im 24/80  bone]
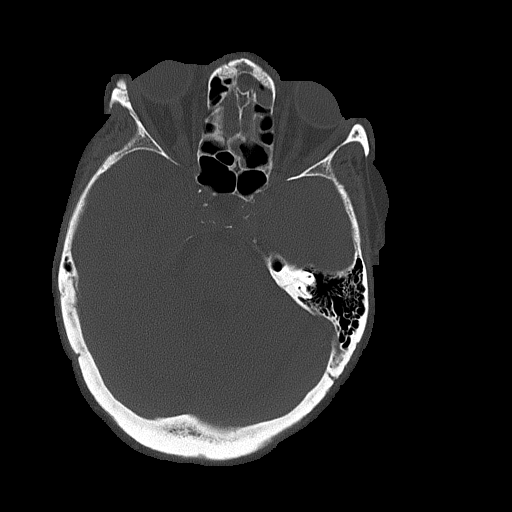

[Series 4: head without cor · coronal · non-contrast · 0.31mm/px · 3 of 65 slices shown]
[im 22/65  brain]
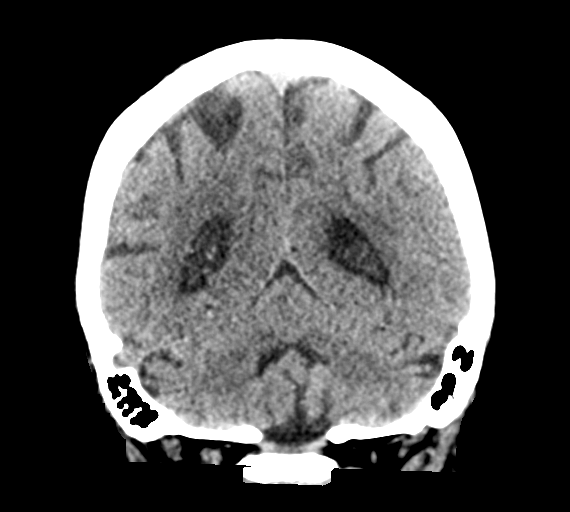
[im 29/65  brain]
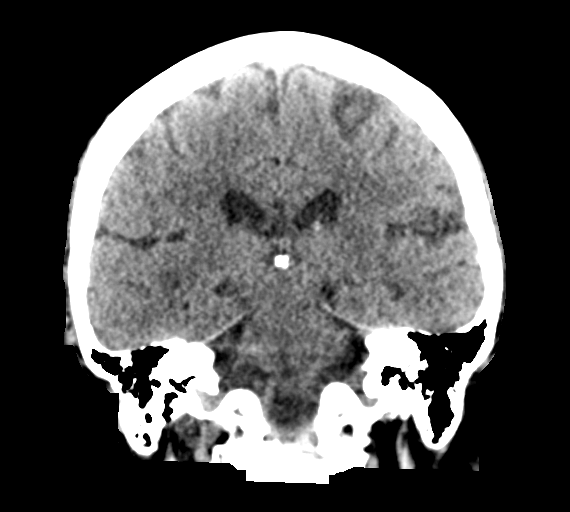
[im 36/65  brain]
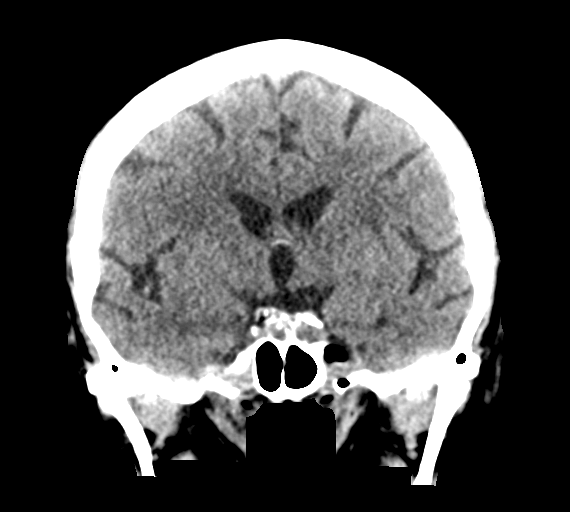

[Series 5: head without sag · sagittal · non-contrast · 0.34mm/px · 3 of 57 slices shown]
[im 19/57  brain]
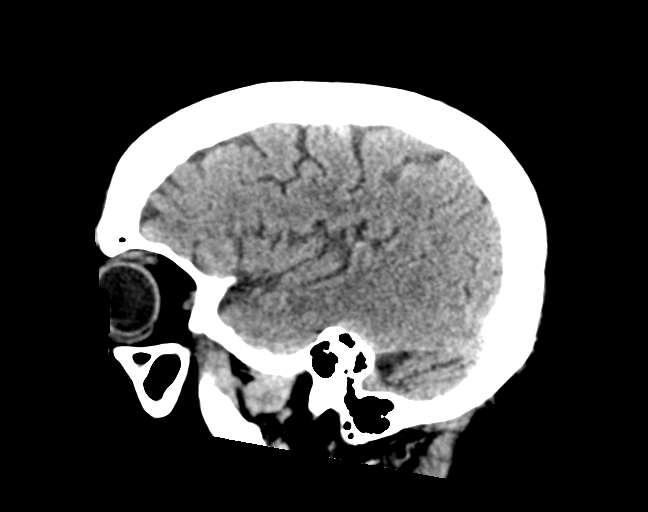
[im 29/57  brain]
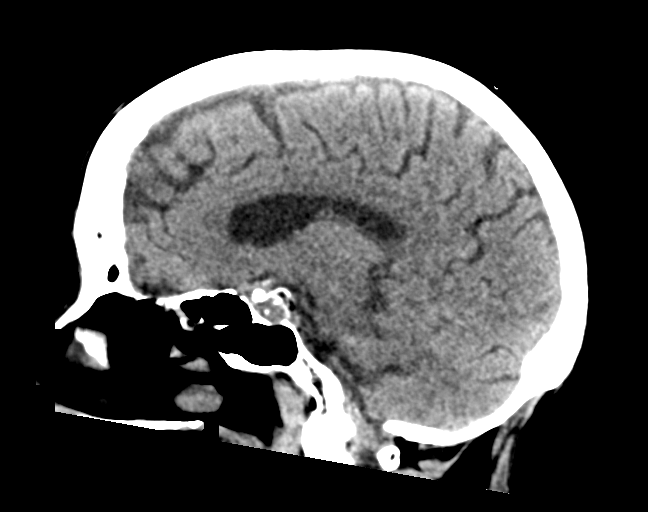
[im 38/57  brain]
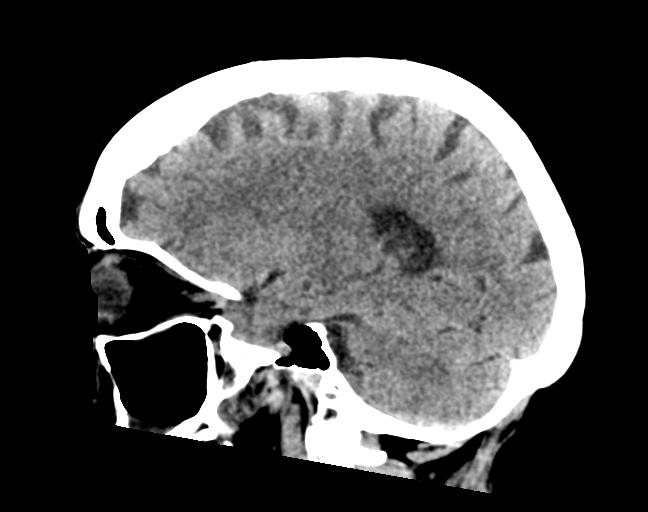

[16 of 47 positions shown; findings below may reference images not displayed]

FINDINGS: Brain: Periventricular white matter changes are consistent with
small vessel disease. There is no intra or extra-axial fluid
collection or mass lesion. The basilar cisterns and ventricles have
a normal appearance. There is no CT evidence for acute infarction or
hemorrhage.

Vascular: There is atherosclerotic calcification of the internal
carotid arteries. No hyperdense vessels.

Skull: Normal. Negative for fracture or focal lesion.

Sinuses/Orbits: There is mucosal thickening of numerous paranasal
sinuses bilaterally. No air-fluid levels. Mastoid air cells are
normally aerated. Orbits are unremarkable.

Other: None.
IMPRESSION: 1. Atrophy and small vessel disease.
2. No evidence for acute intracranial abnormality.
3. Chronic sinusitis.

## 2021-12-27 IMAGING — DX DG CHEST 1V PORT
1 series · 1 of 1 positions shown · non-contrast
Comparison: 04/18/2019

CLINICAL DATA: Back pain and shortness of breath

EXAM:
PORTABLE CHEST 1 VIEW

[chest ap]
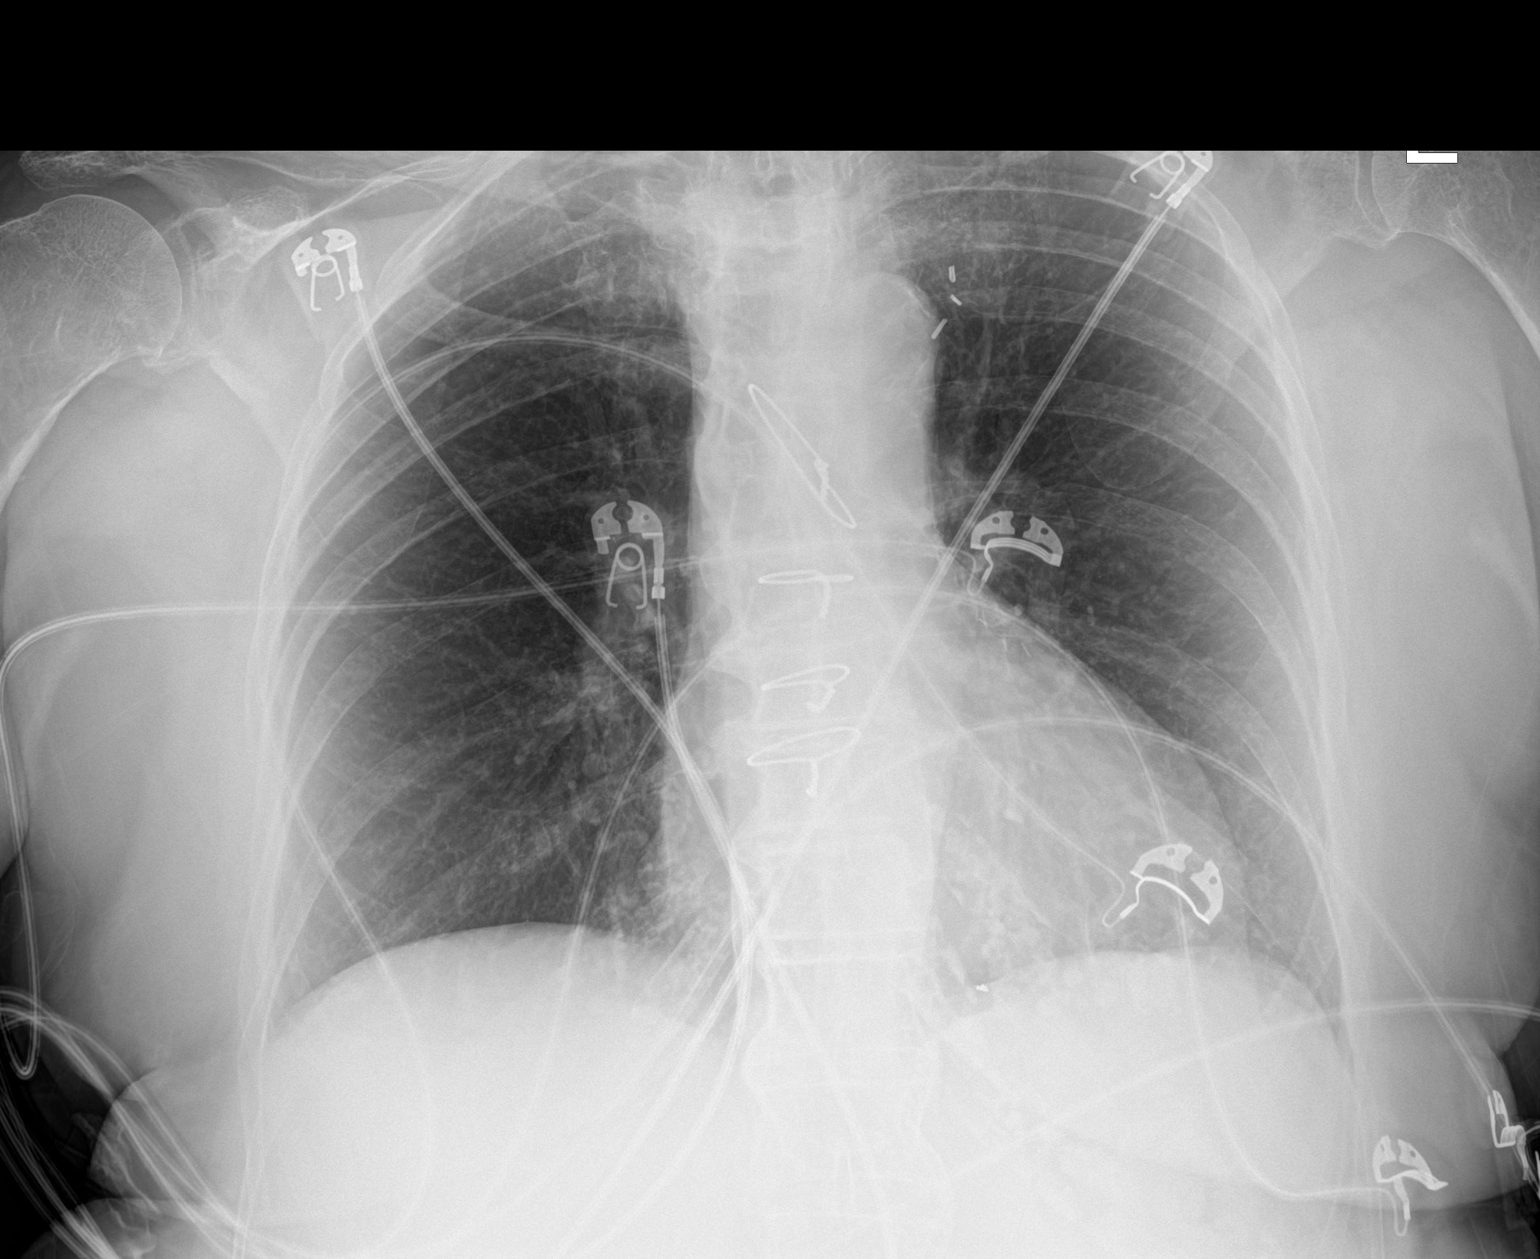

[1 of 1 positions shown; findings below may reference images not displayed]

FINDINGS: Normal heart size for technique. CABG. There is no edema,
consolidation, effusion, or pneumothorax.
IMPRESSION: No acute or interval finding.
